# Patient Record
Sex: Female | Born: 1980 | Race: Black or African American | Hispanic: No | Marital: Single | Smoking: Current every day smoker
Health system: Southern US, Community
[De-identification: ages and names within clinical notes are randomized; demographics above are authoritative.]

## PROBLEM LIST (undated history)

## (undated) ENCOUNTER — Emergency Department (HOSPITAL_COMMUNITY): Admission: EM | Payer: No Typology Code available for payment source | Source: Home / Self Care

## (undated) ENCOUNTER — Emergency Department (HOSPITAL_BASED_OUTPATIENT_CLINIC_OR_DEPARTMENT_OTHER): Admission: EM | Payer: BC Managed Care – PPO | Source: Home / Self Care

## (undated) DIAGNOSIS — R51 Headache: Secondary | ICD-10-CM

## (undated) DIAGNOSIS — R519 Headache, unspecified: Secondary | ICD-10-CM

## (undated) DIAGNOSIS — K429 Umbilical hernia without obstruction or gangrene: Secondary | ICD-10-CM

## (undated) DIAGNOSIS — R0600 Dyspnea, unspecified: Secondary | ICD-10-CM

## (undated) HISTORY — PX: HERNIA REPAIR: SHX51

---

## 2011-09-16 ENCOUNTER — Emergency Department (HOSPITAL_BASED_OUTPATIENT_CLINIC_OR_DEPARTMENT_OTHER)
Admission: EM | Admit: 2011-09-16 | Discharge: 2011-09-16 | Disposition: A | Discharge status: Home / Self Care | Payer: Self-pay | Attending: Emergency Medicine | Admitting: Emergency Medicine

## 2011-09-16 ENCOUNTER — Encounter (HOSPITAL_BASED_OUTPATIENT_CLINIC_OR_DEPARTMENT_OTHER): Payer: Self-pay | Admitting: *Deleted

## 2011-09-16 DIAGNOSIS — N760 Acute vaginitis: Secondary | ICD-10-CM | POA: Insufficient documentation

## 2011-09-16 DIAGNOSIS — R109 Unspecified abdominal pain: Secondary | ICD-10-CM | POA: Insufficient documentation

## 2011-09-16 DIAGNOSIS — A64 Unspecified sexually transmitted disease: Secondary | ICD-10-CM | POA: Insufficient documentation

## 2011-09-16 LAB — PREGNANCY, URINE: Preg Test, Ur: NEGATIVE

## 2011-09-16 LAB — WET PREP, GENITAL: Yeast Wet Prep HPF POC: NONE SEEN

## 2011-09-16 LAB — URINALYSIS, ROUTINE W REFLEX MICROSCOPIC
Bilirubin Urine: NEGATIVE
Glucose, UA: NEGATIVE mg/dL
Hgb urine dipstick: NEGATIVE
Protein, ur: NEGATIVE mg/dL
Urobilinogen, UA: 1 mg/dL (ref 0.0–1.0)

## 2011-09-16 LAB — URINE MICROSCOPIC-ADD ON

## 2011-09-16 MED ORDER — CEFTRIAXONE SODIUM 250 MG IJ SOLR
250.0000 mg | Freq: Once | INTRAMUSCULAR | Status: AC
  Administered 2011-09-16: 250 mg via INTRAMUSCULAR
  Filled 2011-09-16: qty 250

## 2011-09-16 MED ORDER — AZITHROMYCIN 250 MG PO TABS
1000.0000 mg | ORAL_TABLET | Freq: Once | ORAL | Status: AC
  Administered 2011-09-16: 1000 mg via ORAL
  Filled 2011-09-16: qty 4

## 2011-09-16 MED ORDER — LIDOCAINE HCL (PF) 1 % IJ SOLN
INTRAMUSCULAR | Status: AC
  Administered 2011-09-16: 03:00:00
  Filled 2011-09-16: qty 5

## 2011-09-16 MED ORDER — METRONIDAZOLE 500 MG PO TABS: 500.0000 mg | ORAL_TABLET | Freq: Two times a day (BID) | ORAL | Status: AC

## 2011-09-16 NOTE — ED Notes (Signed)
Pt states she has had white vaginal d/c x 1-1/2 weeks. Low abd pain and tender around umbilicus x 1 week. Denies other s/s.

## 2011-09-16 NOTE — Discharge Instructions (Signed)
Bacterial Vaginosis Bacterial vaginosis (BV) is a vaginal infection where the normal balance of bacteria in the vagina is disrupted. The normal balance is then replaced by an overgrowth of certain bacteria. There are several different kinds of bacteria that can cause BV. BV is the most common vaginal infection in women of childbearing age. CAUSES   The cause of BV is not fully understood. BV develops when there is an increase or imbalance of harmful bacteria.   Some activities or behaviors can upset the normal balance of bacteria in the vagina and put women at increased risk including:   Having a new sex partner or multiple sex partners.   Douching.   Using an intrauterine device (IUD) for contraception.   It is not clear what role sexual activity plays in the development of BV. However, women that have never had sexual intercourse are rarely infected with BV.  Women do not get BV from toilet seats, bedding, swimming pools or from touching objects around them.  SYMPTOMS   Grey vaginal discharge.   A fish-like odor with discharge, especially after sexual intercourse.   Itching or burning of the vagina and vulva.   Burning or pain with urination.   Some women have no signs or symptoms at all.  DIAGNOSIS  Your caregiver must examine the vagina for signs of BV. Your caregiver will perform lab tests and look at the sample of vaginal fluid through a microscope. They will look for bacteria and abnormal cells (clue cells), a pH test higher than 4.5, and a positive amine test all associated with BV.  RISKS AND COMPLICATIONS   Pelvic inflammatory disease (PID).   Infections following gynecology surgery.   Developing HIV.   Developing herpes virus.  TREATMENT  Sometimes BV will clear up without treatment. However, all women with symptoms of BV should be treated to avoid complications, especially if gynecology surgery is planned. Female partners generally do not need to be treated. However,  BV may spread between female sex partners so treatment is helpful in preventing a recurrence of BV.   BV may be treated with antibiotics. The antibiotics come in either pill or vaginal cream forms. Either can be used with nonpregnant or pregnant women, but the recommended dosages differ. These antibiotics are not harmful to the baby.   BV can recur after treatment. If this happens, a second round of antibiotics will often be prescribed.   Treatment is important for pregnant women. If not treated, BV can cause a premature delivery, especially for a pregnant woman who had a premature birth in the past. All pregnant women who have symptoms of BV should be checked and treated.   For chronic reoccurrence of BV, treatment with a type of prescribed gel vaginally twice a week is helpful.  HOME CARE INSTRUCTIONS   Finish all medication as directed by your caregiver.   Do not have sex until treatment is completed.   Tell your sexual partner that you have a vaginal infection. They should see their caregiver and be treated if they have problems, such as a mild rash or itching.   Practice safe sex. Use condoms. Only have 1 sex partner.  PREVENTION  Basic prevention steps can help reduce the risk of upsetting the natural balance of bacteria in the vagina and developing BV:  Do not have sexual intercourse (be abstinent).   Do not douche.   Use all of the medicine prescribed for treatment of BV, even if the signs and symptoms go away.     Tell your sex partner if you have BV. That way, they can be treated, if needed, to prevent reoccurrence.  SEEK MEDICAL CARE IF:   Your symptoms are not improving after 3 days of treatment.   You have increased discharge, pain, or fever.  MAKE SURE YOU:   Understand these instructions.   Will watch your condition.   Will get help right away if you are not doing well or get worse.  FOR MORE INFORMATION  Division of STD Prevention (DSTDP), Centers for Disease  Control and Prevention: www.cdc.gov/std American Social Health Association (ASHA): www.ashastd.org  Document Released: 05/28/2005 Document Revised: 05/17/2011 Document Reviewed: 11/18/2008 ExitCare Patient Information 2012 [employer].Cervicitis Cervicitis is a soreness and swelling (inflammation) of the cervix. Your cervix is located at the bottom of your uterus which opens up to the vagina.  CAUSES   Sexually transmitted infections (STIs).   Allergic reaction.   Medicines or birth control devices that are put in the vagina.   Injury to the cervix.   Bacterial infections.  SYMPTOMS  There may be no symptoms. If symptoms occur, they may include:  Grey, white, yellow, or bad smelling vaginal discharge.   Pain or itching of the area outside the vagina.   Painful sexual intercourse.   Lower abdominal or lower back pain, especially during intercourse.   Frequent urination.   Abnormal vaginal bleeding between periods, after sexual intercourse, or after menopause.   Pressure or a heavy feeling in the pelvis.  DIAGNOSIS  Diagnosis is made after a pelvic exam. Other tests may include:  Examination of any discharge under a microscope (wet prep).   A Pap test.  TREATMENT  Treatment will depend on the cause of cervicitis. If it is caused by an STI, both you and your partner will need to be treated. Antibiotic medicines will be given. HOME CARE INSTRUCTIONS   Do not have sexual intercourse until your caregiver says it is okay.   Do not have sexual intercourse until your partner has been treated if your cervicitis is caused by an STI.   Take your antibiotics as directed. Finish them even if you start to feel better.  SEEK IMMEDIATE MEDICAL CARE IF:   Your symptoms come back.   You have a fever.   You experience any problems that may be related to the medicine you are taking.  MAKE SURE YOU:   Understand these instructions.   Will watch your condition.   Will get  help right away if you are not doing well or get worse.  Document Released: 05/28/2005 Document Revised: 05/17/2011 Document Reviewed: 12/25/2010 Exit[employer] Patient Information 2012 [employer]. 

## 2011-09-16 NOTE — ED Provider Notes (Signed)
History     CSN: [id number]  Arrival date & time 09/16/11  0038   First MD Initiated Contact with Patient 09/16/11 [phone]      Chief Complaint  Patient presents with  . Abdominal Pain    (Consider location/radiation/quality/duration/timing/severity/associated sxs/prior treatment) Patient is a 31 y.o. female presenting with abdominal pain. The history is provided by the patient.  Abdominal Pain The primary symptoms of the illness include abdominal pain and vaginal discharge. The primary symptoms of the illness do not include fever, nausea, vomiting, diarrhea, dysuria or vaginal bleeding. Episode onset: One week ago. The onset of the illness was gradual. The problem has been gradually worsening.  The abdominal pain is located in the suprapubic region. The abdominal pain does not radiate. The severity of the abdominal pain is 7/10. The abdominal pain is relieved by nothing.  Onset:  one week ago. Vaginal discharge is a new problem. The patient believes that the vaginal discharge is worsening since it began. The amount of discharge is moderate. The color of the discharge is white. The vaginal discharge is not associated with itching, burning or dysuria.   The patient states that she believes she is currently not pregnant. The patient has not had a change in bowel habit. Symptoms associated with the illness do not include anorexia or back pain. Significant associated medical issues do not include GERD, diabetes or gallstones.    History reviewed. No pertinent past medical history.  History reviewed. No pertinent past surgical history.  History reviewed. No pertinent family history.  History  Substance Use Topics  . Smoking status: Current Everyday Smoker  . Smokeless tobacco: Not on file  . Alcohol Use: No    OB History    Grav Para Term Preterm Abortions TAB SAB Ect Mult Living                  Review of Systems  Constitutional: Negative for fever.  Gastrointestinal: Positive  for abdominal pain. Negative for nausea, vomiting, diarrhea and anorexia.  Genitourinary: Positive for vaginal discharge. Negative for dysuria and vaginal bleeding.  Musculoskeletal: Negative for back pain.  Skin: Negative for itching.  All other systems reviewed and are negative.    Allergies  Review of patient's allergies indicates no known allergies.  Home Medications  No current outpatient prescriptions on file.  BP 118/78  Pulse 68  Temp(Src) 98.1 F (36.7 C) (Oral)  Resp 20  Ht 5\' 8"  (1.727 m)  Wt 190 lb (86.183 kg)  BMI 28.89 kg/m2  SpO2 95%  LMP 09/09/2011  Physical Exam  Nursing note and vitals reviewed. Constitutional: She is oriented to person, place, and time. She appears well-developed and well-nourished. She appears distressed.  HENT:  Head: Normocephalic and atraumatic.  Eyes: EOM are normal. Pupils are equal, round, and reactive to light.  Cardiovascular: Normal rate, regular rhythm, normal heart sounds and intact distal pulses.  Exam reveals no friction rub.   No murmur heard. Pulmonary/Chest: Effort normal and breath sounds normal. She has no wheezes. She has no rales.  Abdominal: Soft. Bowel sounds are normal. She exhibits no distension. There is tenderness in the suprapubic area. There is no rebound and no guarding. No hernia.  Genitourinary: Uterus normal. Uterus is not enlarged and not tender. Cervix exhibits discharge. Cervix exhibits no motion tenderness and no friability. Right adnexum displays tenderness. Right adnexum displays no mass and no fullness. Left adnexum displays no mass, no tenderness and no fullness. Vaginal discharge found.  Musculoskeletal: Normal  range of motion. She exhibits no tenderness.       No edema  Neurological: She is alert and oriented to person, place, and time. No cranial nerve deficit.  Skin: Skin is warm and dry. No rash noted.  Psychiatric: She has a normal mood and affect. Her behavior is normal.    ED Course    Procedures (including critical care time)  Labs Reviewed  URINALYSIS, ROUTINE W REFLEX MICROSCOPIC - Abnormal; Notable for the following:    Leukocytes, UA SMALL (*)    All other components within normal limits  WET PREP, GENITAL - Abnormal; Notable for the following:    Clue Cells Wet Prep HPF POC MODERATE (*)    WBC, Wet Prep HPF POC MANY (*)    All other components within normal limits  URINE MICROSCOPIC-ADD ON - Abnormal; Notable for the following:    Squamous Epithelial / LPF FEW (*)    Bacteria, UA FEW (*)    All other components within normal limits  PREGNANCY, URINE  GC/CHLAMYDIA PROBE AMP, GENITAL   No results found.   No diagnosis found.    MDM   Patient with an abdominal pain and vaginal discharge for the last one week. She denies any new sexual partners but does have unprotected sex. She denies any fever, nausea, vomiting, diarrhea. On exam she has diffuse lower pelvic pain and on pelvic exam some mild right adnexal tenderness no cervical motion tenderness and mild vaginal discharge. Exam and there are no signs of him the local hernias or incarcerated hernias that would be causing the abdominal pain. Most likely GU in origin. Denies any dysuria. He UPT negative. UA, wet prep and GC chlamydia pending.  1:53 AM Many white blood cells on wet prep and signs of bacterial vaginosis. Concern for possible STD and this was discussed with patient and she was covered with Rocephin and azithromycin. She was given prescription for Flagyl.     Gwyneth Sprout, MD 09/16/11 [phone]

## 2011-09-17 LAB — GC/CHLAMYDIA PROBE AMP, GENITAL: GC Probe Amp, Genital: NEGATIVE

## 2012-01-10 ENCOUNTER — Emergency Department (HOSPITAL_BASED_OUTPATIENT_CLINIC_OR_DEPARTMENT_OTHER)
Admission: EM | Admit: 2012-01-10 | Discharge: 2012-01-10 | Disposition: A | Discharge status: Home / Self Care | Payer: BC Managed Care – PPO | Attending: Emergency Medicine | Admitting: Emergency Medicine

## 2012-01-10 ENCOUNTER — Encounter (HOSPITAL_BASED_OUTPATIENT_CLINIC_OR_DEPARTMENT_OTHER): Payer: Self-pay | Admitting: Emergency Medicine

## 2012-01-10 DIAGNOSIS — K458 Other specified abdominal hernia without obstruction or gangrene: Secondary | ICD-10-CM | POA: Insufficient documentation

## 2012-01-10 DIAGNOSIS — N921 Excessive and frequent menstruation with irregular cycle: Secondary | ICD-10-CM | POA: Insufficient documentation

## 2012-01-10 DIAGNOSIS — F17200 Nicotine dependence, unspecified, uncomplicated: Secondary | ICD-10-CM | POA: Insufficient documentation

## 2012-01-10 DIAGNOSIS — R112 Nausea with vomiting, unspecified: Secondary | ICD-10-CM | POA: Insufficient documentation

## 2012-01-10 DIAGNOSIS — B9689 Other specified bacterial agents as the cause of diseases classified elsewhere: Secondary | ICD-10-CM

## 2012-01-10 LAB — WET PREP, GENITAL
Trich, Wet Prep: NONE SEEN
Yeast Wet Prep HPF POC: NONE SEEN

## 2012-01-10 MED ORDER — METRONIDAZOLE 500 MG PO TABS: 500.0000 mg | ORAL_TABLET | Freq: Two times a day (BID) | ORAL | Status: AC

## 2012-01-10 NOTE — ED Notes (Signed)
States she has had a hernia for "a while" but starting to hurt now for past two weeks.  Irregular periods since last month including this month.  States a discharge after each cycle.

## 2012-01-10 NOTE — ED Notes (Signed)
Has had a hernia for 8 years but for the past 2 weeks has been increasingly sore. For the past 3 days has been very sore and tender to touch. Last month had a moderate amount of clear discharge in between her period also had some spotting. After the most recent period she has had white discharge but no spotting. Denies odor to both. Also noticed this week she had a new piece of tissue near her rectum reports it is sore, feels "like there is a cut in it." No bleeding with BM but pain increases with BM.

## 2012-01-10 NOTE — ED Provider Notes (Signed)
History     CSN: [id number]  Arrival date & time 01/10/12  1933   None     Chief Complaint  Patient presents with  . Hernia  . Nausea  . Emesis  . Metrorrhagia    (Consider location/radiation/quality/duration/timing/severity/associated sxs/prior treatment) Patient is a 31 y.o. female presenting with abdominal pain. The history is provided by the patient. No language interpreter was used.  Abdominal Pain The primary symptoms of the illness include abdominal pain and vaginal discharge. Episode onset: 2 weeks. The onset of the illness was gradual. The problem has been gradually worsening.  The patient has not had a change in bowel habit.  Pt reports she has an abdominal hernia.  Pt reports she has had pain at hernia site but it has been worse for the past 2 weeks.  History reviewed. No pertinent past medical history.  History reviewed. No pertinent past surgical history.  No family history on file.  History  Substance Use Topics  . Smoking status: Current Everyday Smoker  . Smokeless tobacco: Not on file  . Alcohol Use: No    OB History    Grav Para Term Preterm Abortions TAB SAB Ect Mult Living                  Review of Systems  Gastrointestinal: Positive for abdominal pain.  Genitourinary: Positive for vaginal discharge.  All other systems reviewed and are negative.    Allergies  Review of patient's allergies indicates no known allergies.  Home Medications  No current outpatient prescriptions on file.  BP 121/73  Pulse 84  Temp 98.6 F (37 C) (Oral)  Resp 16  Ht 5\' 8"  (1.727 m)  Wt 185 lb (83.915 kg)  BMI 28.13 kg/m2  SpO2 98%  LMP 12/26/2011  Physical Exam  Nursing note and vitals reviewed. Constitutional: She is oriented to person, place, and time. She appears well-developed and well-nourished.  HENT:  Head: Normocephalic and atraumatic.  Eyes: Pupils are equal, round, and reactive to light.  Neck: Normal range of motion.  Cardiovascular:  Normal rate and normal heart sounds.   Pulmonary/Chest: Effort normal.  Abdominal: Soft. There is tenderness.       No hernia palpated,  Defect above umbilicus  Genitourinary: Vaginal discharge found.       cevix and adnexa nontender  Musculoskeletal: Normal range of motion.  Neurological: She is alert and oriented to person, place, and time.  Skin: Skin is warm.  Psychiatric: She has a normal mood and affect.    ED Course  Procedures (including critical care time)  Labs Reviewed - No data to display No results found.   No diagnosis found.  Results for orders placed during the hospital encounter of 01/10/12  WET PREP, GENITAL      Component Value Range   Yeast Wet Prep HPF POC NONE SEEN  NONE SEEN   Trich, Wet Prep NONE SEEN  NONE SEEN   Clue Cells Wet Prep HPF POC MODERATE (*) NONE SEEN   WBC, Wet Prep HPF POC MODERATE (*) NONE SEEN  PREGNANCY, URINE      Component Value Range   Preg Test, Ur NEGATIVE  NEGATIVE   No results found.   MDM  Pt given rx for flagyl.   Pt advised to follow up with surgeon for evation of hernia        Lonia Skinner [place], [place] 01/10/12 2121

## 2012-01-11 LAB — GC/CHLAMYDIA PROBE AMP, GENITAL
Chlamydia, DNA Probe: NEGATIVE
GC Probe Amp, Genital: NEGATIVE

## 2012-01-11 NOTE — ED Provider Notes (Signed)
Medical screening examination/treatment/procedure(s) were performed by non-physician practitioner and as supervising physician I was immediately available for consultation/collaboration.   Gwyneth Sprout, MD 01/11/12 [phone]

## 2015-01-03 ENCOUNTER — Encounter (HOSPITAL_BASED_OUTPATIENT_CLINIC_OR_DEPARTMENT_OTHER): Payer: Self-pay | Admitting: *Deleted

## 2015-01-03 ENCOUNTER — Emergency Department (HOSPITAL_BASED_OUTPATIENT_CLINIC_OR_DEPARTMENT_OTHER)
Admission: EM | Admit: 2015-01-03 | Discharge: 2015-01-03 | Disposition: A | Discharge status: Home / Self Care | Payer: BLUE CROSS/BLUE SHIELD | Attending: Emergency Medicine | Admitting: Emergency Medicine

## 2015-01-03 DIAGNOSIS — L0231 Cutaneous abscess of buttock: Secondary | ICD-10-CM | POA: Diagnosis not present

## 2015-01-03 DIAGNOSIS — Z720 Tobacco use: Secondary | ICD-10-CM | POA: Diagnosis not present

## 2015-01-03 DIAGNOSIS — M791 Myalgia: Secondary | ICD-10-CM | POA: Diagnosis present

## 2015-01-03 MED ORDER — LIDOCAINE-EPINEPHRINE (PF) 2 %-1:200000 IJ SOLN
10.0000 mL | Freq: Once | INTRAMUSCULAR | Status: AC
  Administered 2015-01-03: 10 mL via INTRADERMAL
  Filled 2015-01-03: qty 10

## 2015-01-03 NOTE — ED Notes (Signed)
Pt sts she was bitten by something on her right buttocks 3 days ago at work. She believes it may be getting infected.

## 2015-01-03 NOTE — ED Provider Notes (Signed)
CSN: [id number]     Arrival date & time 01/03/15  1818 History  This chart was scribed for non-physician practitioner, Kathrynn Speed, PA-C working with Blake Divine, MD by Placido Sou, ED scribe. This patient was seen in room [facility]FT1/[facility]1 and the patient's care was started at 8:47 PM.   Chief Complaint  Patient presents with  . Insect Bite   The history is provided by the patient. No language interpreter was used.    HPI Comments: Tina Bauer is a 34 y.o. female who presents to the Emergency Department complaining of a point of irritation to her right buttocks with onset 3 days ago. 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 She notes associated itching, redness and mild, constant, pain, worse when sitting. She denies fever or chills.   History reviewed. No pertinent past medical history. History reviewed. No pertinent past surgical history. No family history on file. History  Substance Use Topics  . Smoking status: Current Every Day Smoker  . Smokeless tobacco: Not on file  . Alcohol Use: Yes   OB History    No data available     Review of Systems  Constitutional: Negative for fever and chills.  Musculoskeletal: Positive for myalgias.  Skin: Positive for color change.  All other systems reviewed and are negative.  Allergies  Review of patient's allergies indicates no known allergies.  Home Medications   Prior to Admission medications   Not on File   BP 116/73 mmHg  Pulse 86  Temp(Src) 98.5 F (36.9 C) (Oral)  Resp 18  Ht  (1.702 m)  Wt 205 lb (92.987 kg)  BMI 32.10 kg/m2  SpO2 99%  LMP 01/01/2015 Physical Exam  Constitutional: She is oriented to person, place, and time. She appears well-developed and well-nourished. No distress.  HENT:  Head: Normocephalic and atraumatic.  Mouth/Throat: Oropharynx is clear and moist.  Eyes: Conjunctivae are normal.  Neck: Normal range of motion. Neck supple.   Cardiovascular: Normal rate, regular rhythm and normal heart sounds.   Pulmonary/Chest: Effort normal and breath sounds normal.  Musculoskeletal: Normal range of motion. She exhibits no edema.  Neurological: She is alert and oriented to person, place, and time.  Skin: Skin is warm and dry. She is not diaphoretic.  3 cm area of induration to R buttock. No erythema or warmth. No drainage. Tender.  Psychiatric: She has a normal mood and affect. Her behavior is normal.    ED Course  Procedures   INCISION AND DRAINAGE Performed by: Celene Skeen Consent: Verbal consent obtained. Risks and benefits: risks, benefits and alternatives were discussed Type: abscess  Body area: right buttock  Anesthesia: local infiltration  Incision was made with a scalpel.  Local anesthetic: lidocaine 2% with epinephrine  Anesthetic total: 2 ml  Complexity: complex Blunt dissection to break up loculations  Drainage: purulent  Drainage amount: small  Packing material: none  Patient tolerance: Patient tolerated the procedure well with no immediate complications.   DIAGNOSTIC STUDIES: Oxygen Saturation is 99% on AR, normal by my interpretation.    COORDINATION OF CARE: 8:49 PM Discussed treatment plan with pt at bedside and pt agreed to plan.  Labs Review Labs Reviewed - No data to display  Imaging Review No results found.   EKG Interpretation None      MDM   Final diagnoses:  Abscess of buttock, right   NAD. AF VSS. No surrounding cellulitis. Abscess drained with  only a small amount of drainage. Advised warm compresses. Antibiotics are not appropriate at this time as there are no signs of surrounding cellulitis or fever. Stable for discharge. F/u with PCP within 1-2 days for recheck. Return precautions given. Patient states understanding of treatment care plan and is agreeable. Patient left prior to receiving discharge papers.  I personally performed the services described in this  documentation, which was scribed in my presence. The recorded information has been reviewed and is accurate.  Kathrynn Speed, PA-C 01/03/15 2159  Blake Divine, MD 01/05/15 [phone]

## 2015-01-03 NOTE — Discharge Instructions (Signed)
Abscess °An abscess is an infected area that contains a collection of pus and debris. It can occur in almost any part of the body. An abscess is also known as a furuncle or boil. °CAUSES  °An abscess occurs when tissue gets infected. This can occur from blockage of oil or sweat glands, infection of hair follicles, or a minor injury to the skin. As the body tries to fight the infection, pus collects in the area and creates pressure under the skin. This pressure causes pain. People with weakened immune systems have difficulty fighting infections and get certain abscesses more often.  °SYMPTOMS °Usually an abscess develops on the skin and becomes a painful mass that is red, warm, and tender. If the abscess forms under the skin, you may feel a moveable soft area under the skin. Some abscesses break open (rupture) on their own, but most will continue to get worse without care. The infection can spread deeper into the body and eventually into the bloodstream, causing you to feel ill.  °DIAGNOSIS  °Your caregiver will take your medical history and perform a physical exam. A sample of fluid may also be taken from the abscess to determine what is causing your infection. °TREATMENT  °Your caregiver may prescribe antibiotic medicines to fight the infection. However, taking antibiotics alone usually does not cure an abscess. Your caregiver may need to make a small cut (incision) in the abscess to drain the pus. In some cases, gauze is packed into the abscess to reduce pain and to continue draining the area. °HOME CARE INSTRUCTIONS  °· Only take over-the-counter or prescription medicines for pain, discomfort, or fever as directed by your caregiver. °· If you were prescribed antibiotics, take them as directed. Finish them even if you start to feel better. °· If gauze is used, follow your caregiver's directions for changing the gauze. °· To avoid spreading the infection: °· Keep your draining abscess covered with a  bandage. °· Wash your hands well. °· Do not share personal care items, towels, or whirlpools with others. °· Avoid skin contact with others. °· Keep your skin and clothes clean around the abscess. °· Keep all follow-up appointments as directed by your caregiver. °SEEK MEDICAL CARE IF:  °· You have increased pain, swelling, redness, fluid drainage, or bleeding. °· You have muscle aches, chills, or a general ill feeling. °· You have a fever. °MAKE SURE YOU:  °· Understand these instructions. °· Will watch your condition. °· Will get help right away if you are not doing well or get worse. °Document Released: 03/07/2005 Document Revised: 11/27/2011 Document Reviewed: 08/10/2011 °[employer]® Patient Information ©2015 [employer]. This information is not intended to replace advice given to you by your health care provider. Make sure you discuss any questions you have with your health care provider. ° °Abscess °Care After °An abscess (also called a boil or furuncle) is an infected area that contains a collection of pus. Signs and symptoms of an abscess include pain, tenderness, redness, or hardness, or you may feel a moveable soft area under your skin. An abscess can occur anywhere in the body. The infection may spread to surrounding tissues causing cellulitis. A cut (incision) by the surgeon was made over your abscess and the pus was drained out. Gauze may have been packed into the space to provide a drain that will allow the cavity to heal from the inside outwards. The boil may be painful for 5 to 7 days. Most people with a boil do not have   high fevers. Your abscess, if seen early, may not have localized, and may not have been lanced. If not, another appointment may be required for this if it does not get better on its own or with medications. °HOME CARE INSTRUCTIONS  °· Only take over-the-counter or prescription medicines for pain, discomfort, or fever as directed by your caregiver. °· When you bathe, soak and then  remove gauze or iodoform packs at least daily or as directed by your caregiver. You may then wash the wound gently with mild soapy water. Repack with gauze or do as your caregiver directs. °SEEK IMMEDIATE MEDICAL CARE IF:  °· You develop increased pain, swelling, redness, drainage, or bleeding in the wound site. °· You develop signs of generalized infection including muscle aches, chills, fever, or a general ill feeling. °· An oral temperature above 102° F (38.9° C) develops, not controlled by medication. °See your caregiver for a recheck if you develop any of the symptoms described above. If medications (antibiotics) were prescribed, take them as directed. °Document Released: 12/14/2004 Document Revised: 08/20/2011 Document Reviewed: 08/11/2007 °[employer]® Patient Information ©2015 [employer]. This information is not intended to replace advice given to you by your health care provider. Make sure you discuss any questions you have with your health care provider. ° °

## 2015-02-22 ENCOUNTER — Encounter (HOSPITAL_BASED_OUTPATIENT_CLINIC_OR_DEPARTMENT_OTHER): Payer: Self-pay | Admitting: Emergency Medicine

## 2015-02-22 ENCOUNTER — Emergency Department (HOSPITAL_BASED_OUTPATIENT_CLINIC_OR_DEPARTMENT_OTHER)
Admission: EM | Admit: 2015-02-22 | Discharge: 2015-02-23 | Disposition: A | Discharge status: Home / Self Care | Payer: BLUE CROSS/BLUE SHIELD | Attending: Emergency Medicine | Admitting: Emergency Medicine

## 2015-02-22 DIAGNOSIS — Z720 Tobacco use: Secondary | ICD-10-CM | POA: Insufficient documentation

## 2015-02-22 DIAGNOSIS — Z3202 Encounter for pregnancy test, result negative: Secondary | ICD-10-CM | POA: Diagnosis not present

## 2015-02-22 DIAGNOSIS — N898 Other specified noninflammatory disorders of vagina: Secondary | ICD-10-CM | POA: Insufficient documentation

## 2015-02-22 LAB — URINALYSIS, ROUTINE W REFLEX MICROSCOPIC
BILIRUBIN URINE: NEGATIVE
Glucose, UA: NEGATIVE mg/dL
Hgb urine dipstick: NEGATIVE
KETONES UR: NEGATIVE mg/dL
Leukocytes, UA: NEGATIVE
NITRITE: NEGATIVE
Protein, ur: NEGATIVE mg/dL
Specific Gravity, Urine: 1.038 — ABNORMAL HIGH (ref 1.005–1.030)
UROBILINOGEN UA: 1 mg/dL (ref 0.0–1.0)
pH: 6 (ref 5.0–8.0)

## 2015-02-22 LAB — PREGNANCY, URINE: PREG TEST UR: NEGATIVE

## 2015-02-22 NOTE — ED Notes (Signed)
Patient reports that she is having an abnormal discharge post LMP - she also reports that she may have a boil in her perineal area.

## 2015-02-23 LAB — WET PREP, GENITAL
Trich, Wet Prep: NONE SEEN
WBC, Wet Prep HPF POC: NONE SEEN
YEAST WET PREP: NONE SEEN

## 2015-02-23 MED ORDER — MUPIROCIN CALCIUM 2 % EX CREA
TOPICAL_CREAM | Freq: Three times a day (TID) | CUTANEOUS | Status: DC
  Administered 2015-02-23: 02:00:00 via TOPICAL
  Filled 2015-02-23: qty 15

## 2015-02-23 NOTE — ED Provider Notes (Addendum)
CSN: [id number]     Arrival date & time 02/22/15  2245 History   First MD Initiated Contact with Patient 02/23/15 0103     Chief Complaint  Patient presents with  . Vaginal Discharge     (Consider location/radiation/quality/duration/timing/severity/associated sxs/prior Treatment) HPI  This is a 34 year old female who had a vaginal discharge before her most recent menses, 02/21/2015. After her menses ended the discharge recurred. The discharge is white with yellow tinge. It is not profuse. It has a somewhat abnormal odor. She has occasional cramping pain which is not severe. She denies fever, chills, nausea, vomiting or diarrhea.  History reviewed. No pertinent past medical history. History reviewed. No pertinent past surgical history. History reviewed. No pertinent family history. Social History  Substance Use Topics  . Smoking status: Current Every Day Smoker  . Smokeless tobacco: None  . Alcohol Use: Yes   OB History    No data available     Review of Systems  All other systems reviewed and are negative.   Allergies  Review of patient's allergies indicates no known allergies.  Home Medications   Prior to Admission medications   Not on File   BP 122/75 mmHg  Pulse 80  Temp(Src) 97.9 F (36.6 C) (Oral)  Resp 16  Ht 5\' 7"  (1.702 m)  Wt 198 lb (89.812 kg)  BMI 31.00 kg/m2  SpO2 81%  LMP 02/21/2015   Physical Exam  General: Well-developed, well-nourished female in no acute distress; appearance consistent with age of record HENT: normocephalic; atraumatic Eyes: pupils equal, round and reactive to light; extraocular muscles intact Neck: supple Heart: regular rate and rhythm Lungs: clear to auscultation bilaterally Abdomen: soft; nondistended; nontender; no masses or hepatosplenomegaly; bowel sounds present GU: No CVA tenderness; normal external genitalia; yellow, mucoid vaginal discharge; no vaginal bleeding; equivocal cervical motion tenderness Extremities: No  deformity; full range of motion; pulses normal Neurologic: Awake, alert and oriented; motor function intact in all extremities and symmetric; no facial droop Skin: Warm and dry Psychiatric: Normal mood and affect    ED Course  Procedures (including critical care time)   MDM   Nursing notes and vitals signs, including pulse oximetry, reviewed.  Summary of this visit's results, reviewed by myself:  Labs:  Results for orders placed or performed during the hospital encounter of 02/22/15 (from the past 24 hour(s))  Urinalysis, Routine w reflex microscopic (not at [facility])     Status: Abnormal   Collection Time: 02/22/15 11:00 PM  Result Value Ref Range   Color, Urine YELLOW YELLOW   APPearance CLOUDY (A) CLEAR   Specific Gravity, Urine 1.038 (H) 1.005 - 1.030   pH 6.0 5.0 - 8.0   Glucose, UA NEGATIVE NEGATIVE mg/dL   Hgb urine dipstick NEGATIVE NEGATIVE   Bilirubin Urine NEGATIVE NEGATIVE   Ketones, ur NEGATIVE NEGATIVE mg/dL   Protein, ur NEGATIVE NEGATIVE mg/dL   Urobilinogen, UA 1.0 0.0 - 1.0 mg/dL   Nitrite NEGATIVE NEGATIVE   Leukocytes, UA NEGATIVE NEGATIVE  Pregnancy, urine     Status: None   Collection Time: 02/22/15 11:00 PM  Result Value Ref Range   Preg Test, Ur NEGATIVE NEGATIVE  Wet prep, genital     Status: Abnormal   Collection Time: 02/23/15  1:15 AM  Result Value Ref Range   Yeast Wet Prep HPF POC NONE SEEN NONE SEEN   Trich, Wet Prep NONE SEEN NONE SEEN   Clue Cells Wet Prep HPF POC FEW (A) NONE SEEN   WBC,  Wet Prep HPF POC NONE SEEN NONE SEEN   Patient is advised that GC/chlamydia testing is pending.    Paula Libra, MD 02/23/15 1610  Paula Libra, MD 02/23/15 [id number]

## 2015-02-24 LAB — GC/CHLAMYDIA PROBE AMP (~~LOC~~) NOT AT ARMC
CHLAMYDIA, DNA PROBE: NEGATIVE
NEISSERIA GONORRHEA: NEGATIVE

## 2015-03-01 ENCOUNTER — Telehealth (HOSPITAL_COMMUNITY): Payer: Self-pay

## 2016-06-23 ENCOUNTER — Emergency Department (HOSPITAL_COMMUNITY)
Admission: EM | Admit: 2016-06-23 | Discharge: 2016-06-23 | Disposition: A | Discharge status: Home / Self Care | Payer: Self-pay | Attending: Emergency Medicine | Admitting: Emergency Medicine

## 2016-06-23 ENCOUNTER — Encounter (HOSPITAL_COMMUNITY): Payer: Self-pay | Admitting: *Deleted

## 2016-06-23 ENCOUNTER — Emergency Department (HOSPITAL_COMMUNITY): Payer: Self-pay

## 2016-06-23 DIAGNOSIS — R938 Abnormal findings on diagnostic imaging of other specified body structures: Secondary | ICD-10-CM | POA: Insufficient documentation

## 2016-06-23 DIAGNOSIS — S50312A Abrasion of left elbow, initial encounter: Secondary | ICD-10-CM | POA: Insufficient documentation

## 2016-06-23 DIAGNOSIS — F10129 Alcohol abuse with intoxication, unspecified: Secondary | ICD-10-CM | POA: Insufficient documentation

## 2016-06-23 DIAGNOSIS — Z23 Encounter for immunization: Secondary | ICD-10-CM | POA: Insufficient documentation

## 2016-06-23 DIAGNOSIS — Y92410 Unspecified street and highway as the place of occurrence of the external cause: Secondary | ICD-10-CM | POA: Insufficient documentation

## 2016-06-23 DIAGNOSIS — Y939 Activity, unspecified: Secondary | ICD-10-CM | POA: Insufficient documentation

## 2016-06-23 DIAGNOSIS — F10920 Alcohol use, unspecified with intoxication, uncomplicated: Secondary | ICD-10-CM

## 2016-06-23 DIAGNOSIS — R930 Abnormal findings on diagnostic imaging of skull and head, not elsewhere classified: Secondary | ICD-10-CM | POA: Insufficient documentation

## 2016-06-23 DIAGNOSIS — R1032 Left lower quadrant pain: Secondary | ICD-10-CM | POA: Insufficient documentation

## 2016-06-23 DIAGNOSIS — S52502A Unspecified fracture of the lower end of left radius, initial encounter for closed fracture: Secondary | ICD-10-CM | POA: Insufficient documentation

## 2016-06-23 DIAGNOSIS — Y999 Unspecified external cause status: Secondary | ICD-10-CM | POA: Insufficient documentation

## 2016-06-23 LAB — CBC
HCT: 38.8 % (ref 36.0–46.0)
Hemoglobin: 13.2 g/dL (ref 12.0–15.0)
MCH: 31.9 pg (ref 26.0–34.0)
MCHC: 34 g/dL (ref 30.0–36.0)
MCV: 93.7 fL (ref 78.0–100.0)
PLATELETS: 262 10*3/uL (ref 150–400)
RBC: 4.14 MIL/uL (ref 3.87–5.11)
RDW: 13.1 % (ref 11.5–15.5)
WBC: 13.1 10*3/uL — ABNORMAL HIGH (ref 4.0–10.5)

## 2016-06-23 LAB — I-STAT CHEM 8, ED
BUN: 10 mg/dL (ref 6–20)
CHLORIDE: 109 mmol/L (ref 101–111)
CREATININE: 1.2 mg/dL — AB (ref 0.44–1.00)
Calcium, Ion: 1.09 mmol/L — ABNORMAL LOW (ref 1.15–1.40)
GLUCOSE: 147 mg/dL — AB (ref 65–99)
HEMATOCRIT: 39 % (ref 36.0–46.0)
Hemoglobin: 13.3 g/dL (ref 12.0–15.0)
POTASSIUM: 3.5 mmol/L (ref 3.5–5.1)
Sodium: 143 mmol/L (ref 135–145)
TCO2: 23 mmol/L (ref 0–100)

## 2016-06-23 LAB — ETHANOL: ALCOHOL ETHYL (B): 173 mg/dL — AB (ref <5)

## 2016-06-23 LAB — COMPREHENSIVE METABOLIC PANEL
ALK PHOS: 63 U/L (ref 38–126)
ALT: 18 U/L (ref 14–54)
AST: 20 U/L (ref 15–41)
Albumin: 3.6 g/dL (ref 3.5–5.0)
Anion gap: 8 (ref 5–15)
BILIRUBIN TOTAL: 0.2 mg/dL — AB (ref 0.3–1.2)
BUN: 9 mg/dL (ref 6–20)
CALCIUM: 8.6 mg/dL — AB (ref 8.9–10.3)
CO2: 22 mmol/L (ref 22–32)
CREATININE: 1.05 mg/dL — AB (ref 0.44–1.00)
Chloride: 110 mmol/L (ref 101–111)
GFR calc Af Amer: >60 mL/min (ref >60)
Glucose, Bld: 149 mg/dL — ABNORMAL HIGH (ref 65–99)
Potassium: 3.4 mmol/L — ABNORMAL LOW (ref 3.5–5.1)
Sodium: 140 mmol/L (ref 135–145)
Total Protein: 6.3 g/dL — ABNORMAL LOW (ref 6.5–8.1)

## 2016-06-23 LAB — I-STAT CG4 LACTIC ACID, ED: Lactic Acid, Venous: 2.03 mmol/L (ref 0.5–1.9)

## 2016-06-23 LAB — PROTIME-INR
INR: 1
Prothrombin Time: 13.2 seconds (ref 11.4–15.2)

## 2016-06-23 LAB — CDS SEROLOGY

## 2016-06-23 LAB — SAMPLE TO BLOOD BANK

## 2016-06-23 LAB — HCG, QUANTITATIVE, PREGNANCY: HCG, BETA CHAIN, QUANT, S: 1 m[IU]/mL (ref <5)

## 2016-06-23 MED ORDER — TETANUS-DIPHTH-ACELL PERTUSSIS 5-2.5-18.5 LF-MCG/0.5 IM SUSP
0.5000 mL | Freq: Once | INTRAMUSCULAR | Status: AC
  Administered 2016-06-23: 0.5 mL via INTRAMUSCULAR
  Filled 2016-06-23: qty 0.5

## 2016-06-23 MED ORDER — FENTANYL CITRATE (PF) 100 MCG/2ML IJ SOLN: 50.0000 ug | Freq: Once | INTRAMUSCULAR | Status: DC

## 2016-06-23 MED ORDER — MORPHINE SULFATE 15 MG PO TABS: 15.0000 mg | ORAL_TABLET | ORAL | 0 refills | Status: DC | PRN

## 2016-06-23 MED ORDER — IOPAMIDOL (ISOVUE-300) INJECTION 61%
INTRAVENOUS | Status: AC
  Administered 2016-06-23: 100 mL
  Filled 2016-06-23: qty 100

## 2016-06-23 NOTE — Discharge Instructions (Signed)
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
## 2016-06-23 NOTE — ED Notes (Signed)
The p[t finally woke up asking for her mother  She did not give us her moithers number when she arrived

## 2016-06-23 NOTE — ED Notes (Addendum)
Pt's mother arrived and is at bedside

## 2016-06-23 NOTE — ED Notes (Addendum)
Pt discharged home with mother and sister. Pt given paper scrubs, taken out in wheelchair. Pt is alert, reporting soreness all over, is tearful.
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
## 2016-06-23 NOTE — Progress Notes (Signed)
Orthopedic Tech Progress Note Patient Details:  Tina BoraCrystal Bauer 03-Jun-1981 [id number]  Ortho Devices Type of Ortho Device: Ace wrap, Arm sling, Sugartong splint Ortho Device/Splint Interventions: Application   Saul FordyceJennifer C Jesica Goheen 06/23/2016, 7:54 AM
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
## 2016-06-24 ENCOUNTER — Encounter (HOSPITAL_BASED_OUTPATIENT_CLINIC_OR_DEPARTMENT_OTHER): Payer: Self-pay | Admitting: Emergency Medicine

## 2016-06-26 ENCOUNTER — Emergency Department (HOSPITAL_BASED_OUTPATIENT_CLINIC_OR_DEPARTMENT_OTHER)
Admission: EM | Admit: 2016-06-26 | Discharge: 2016-06-27 | Disposition: A | Discharge status: Home / Self Care | Payer: BLUE CROSS/BLUE SHIELD | Attending: Emergency Medicine | Admitting: Emergency Medicine

## 2016-06-26 ENCOUNTER — Encounter (HOSPITAL_BASED_OUTPATIENT_CLINIC_OR_DEPARTMENT_OTHER): Payer: Self-pay | Admitting: *Deleted

## 2016-06-26 DIAGNOSIS — S39011D Strain of muscle, fascia and tendon of abdomen, subsequent encounter: Secondary | ICD-10-CM | POA: Diagnosis not present

## 2016-06-26 DIAGNOSIS — S36429D Contusion of unspecified part of small intestine, subsequent encounter: Secondary | ICD-10-CM | POA: Insufficient documentation

## 2016-06-26 DIAGNOSIS — S76012D Strain of muscle, fascia and tendon of left hip, subsequent encounter: Secondary | ICD-10-CM

## 2016-06-26 DIAGNOSIS — S3991XD Unspecified injury of abdomen, subsequent encounter: Secondary | ICD-10-CM | POA: Diagnosis present
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
## 2016-06-27 ENCOUNTER — Emergency Department (HOSPITAL_BASED_OUTPATIENT_CLINIC_OR_DEPARTMENT_OTHER): Payer: BLUE CROSS/BLUE SHIELD

## 2016-06-27 DIAGNOSIS — S36429D Contusion of unspecified part of small intestine, subsequent encounter: Secondary | ICD-10-CM | POA: Diagnosis not present

## 2016-06-27 LAB — CBC WITH DIFFERENTIAL/PLATELET
BASOS ABS: 0 10*3/uL (ref 0.0–0.1)
BASOS PCT: 0 %
EOS ABS: 0.4 10*3/uL (ref 0.0–0.7)
EOS PCT: 3 %
HCT: 34.1 % — ABNORMAL LOW (ref 36.0–46.0)
Hemoglobin: 11.6 g/dL — ABNORMAL LOW (ref 12.0–15.0)
Lymphocytes Relative: 20 %
Lymphs Abs: 2.4 10*3/uL (ref 0.7–4.0)
MCH: 32 pg (ref 26.0–34.0)
MCHC: 34 g/dL (ref 30.0–36.0)
MCV: 94.2 fL (ref 78.0–100.0)
MONO ABS: 1.4 10*3/uL — AB (ref 0.1–1.0)
Monocytes Relative: 12 %
Neutro Abs: 7.8 10*3/uL — ABNORMAL HIGH (ref 1.7–7.7)
Neutrophils Relative %: 65 %
PLATELETS: 247 10*3/uL (ref 150–400)
RBC: 3.62 MIL/uL — AB (ref 3.87–5.11)
RDW: 12.2 % (ref 11.5–15.5)
WBC: 12.1 10*3/uL — AB (ref 4.0–10.5)

## 2016-06-27 LAB — COMPREHENSIVE METABOLIC PANEL
ALT: 21 U/L (ref 14–54)
AST: 24 U/L (ref 15–41)
Albumin: 3.2 g/dL — ABNORMAL LOW (ref 3.5–5.0)
Alkaline Phosphatase: 59 U/L (ref 38–126)
Anion gap: 6 (ref 5–15)
BUN: 16 mg/dL (ref 6–20)
CHLORIDE: 108 mmol/L (ref 101–111)
CO2: 22 mmol/L (ref 22–32)
Calcium: 8.5 mg/dL — ABNORMAL LOW (ref 8.9–10.3)
Creatinine, Ser: 0.68 mg/dL (ref 0.44–1.00)
GFR calc Af Amer: >60 mL/min (ref >60)
Glucose, Bld: 93 mg/dL (ref 65–99)
Potassium: 3.9 mmol/L (ref 3.5–5.1)
SODIUM: 136 mmol/L (ref 135–145)
Total Bilirubin: 0.4 mg/dL (ref 0.3–1.2)
Total Protein: 6.5 g/dL (ref 6.5–8.1)

## 2016-06-27 MED ORDER — ONDANSETRON HCL 4 MG/2ML IJ SOLN
4.0000 mg | Freq: Once | INTRAMUSCULAR | Status: AC
  Administered 2016-06-27: 4 mg via INTRAVENOUS
  Filled 2016-06-27: qty 2

## 2016-06-27 MED ORDER — FENTANYL CITRATE (PF) 100 MCG/2ML IJ SOLN
100.0000 ug | Freq: Once | INTRAMUSCULAR | Status: AC
  Administered 2016-06-27: 100 ug via INTRAVENOUS
  Filled 2016-06-27: qty 2

## 2016-06-27 MED ORDER — HYDROCODONE-ACETAMINOPHEN 5-325 MG PO TABS
1.0000 | ORAL_TABLET | Freq: Four times a day (QID) | ORAL | 0 refills | Status: DC | PRN
Start: 2016-06-27 — End: 2016-07-12

## 2016-06-27 MED ORDER — IOPAMIDOL (ISOVUE-300) INJECTION 61%
100.0000 mL | Freq: Once | INTRAVENOUS | Status: AC | PRN
  Administered 2016-06-27: 100 mL via INTRAVENOUS

## 2016-06-27 NOTE — ED Notes (Signed)
Pt reassessed, diffuse tenderness in all quadrants, no periumbilical or flank ecchymosis present

## 2016-07-05 ENCOUNTER — Ambulatory Visit: Payer: Self-pay | Admitting: Orthopedic Surgery

## 2016-07-06 ENCOUNTER — Encounter (HOSPITAL_COMMUNITY)
Admission: RE | Admit: 2016-07-06 | Discharge: 2016-07-06 | Disposition: A | Discharge status: Home / Self Care | Payer: BLUE CROSS/BLUE SHIELD | Source: Ambulatory Visit | Attending: Orthopedic Surgery | Admitting: Orthopedic Surgery

## 2016-07-06 ENCOUNTER — Other Ambulatory Visit (HOSPITAL_COMMUNITY): Payer: Self-pay | Admitting: *Deleted

## 2016-07-06 ENCOUNTER — Encounter (HOSPITAL_COMMUNITY): Payer: Self-pay

## 2016-07-06 DIAGNOSIS — Z01812 Encounter for preprocedural laboratory examination: Secondary | ICD-10-CM | POA: Diagnosis not present

## 2016-07-06 DIAGNOSIS — X58XXXA Exposure to other specified factors, initial encounter: Secondary | ICD-10-CM | POA: Diagnosis not present

## 2016-07-06 DIAGNOSIS — S5292XA Unspecified fracture of left forearm, initial encounter for closed fracture: Secondary | ICD-10-CM | POA: Insufficient documentation

## 2016-07-06 HISTORY — DX: Dyspnea, unspecified: R06.00

## 2016-07-06 LAB — CBC
HEMATOCRIT: 38.9 % (ref 36.0–46.0)
Hemoglobin: 13.5 g/dL (ref 12.0–15.0)
MCH: 32.5 pg (ref 26.0–34.0)
MCHC: 34.7 g/dL (ref 30.0–36.0)
MCV: 93.7 fL (ref 78.0–100.0)
PLATELETS: 384 10*3/uL (ref 150–400)
RBC: 4.15 MIL/uL (ref 3.87–5.11)
RDW: 12.8 % (ref 11.5–15.5)
WBC: 10.1 10*3/uL (ref 4.0–10.5)

## 2016-07-06 LAB — HCG, SERUM, QUALITATIVE: Preg, Serum: NEGATIVE

## 2016-07-06 LAB — SURGICAL PCR SCREEN
MRSA, PCR: NEGATIVE
STAPHYLOCOCCUS AUREUS: NEGATIVE

## 2016-07-06 NOTE — Pre-Procedure Instructions (Signed)
    Tina Bauer  07/06/2016      [facility] [place], [place] - [id number] [place] [address] Phone: [phone] Fax: [phone]  [facility] [phone] - [place], [place] - [address] [place] [id number] Phone: [phone] Fax: [phone]    Your procedure is scheduled on 07-07-2016    Saturday .  [facility]  The [facility] At 6:00 AM. Let them know that you are here for Surgery. They will check you in and get you to the right Department.    Call this number if you have problems the morning of surgery:  [phone]   Remember:  Do not eat food or drink liquids after midnight.   Take these medicines the morning of surgery with A SIP OF WATER none   Do not wear jewelry, make-up or nail polish.  Do not wear lotions, powders, or perfumes, or Deoderant.  Do not shave 48 hours prior to surgery.     Do not bring valuables to the hospital.  [facility] is not responsible for any belongings or valuables.  Contacts, dentures or bridgework may not be worn into surgery.  Leave your suitcase in the car.  After surgery it may be brought to your room.  For patients admitted to the hospital, discharge time will be determined by your treatment team.  Patients discharged the day of surgery will not be allowed to drive home.    Special instructions:  See the attached Sheet for instructions on CHG showers

## 2016-07-07 ENCOUNTER — Encounter (HOSPITAL_COMMUNITY): Admission: RE | Payer: Self-pay | Source: Ambulatory Visit

## 2016-07-07 ENCOUNTER — Ambulatory Visit (HOSPITAL_COMMUNITY)
Admission: RE | Admit: 2016-07-07 | Payer: BLUE CROSS/BLUE SHIELD | Source: Ambulatory Visit | Admitting: Orthopedic Surgery

## 2016-07-07 SURGERY — OPEN REDUCTION INTERNAL FIXATION (ORIF) DISTAL RADIUS FRACTURE
Anesthesia: General | Laterality: Left

## 2016-07-11 ENCOUNTER — Ambulatory Visit: Payer: Self-pay | Admitting: Orthopedic Surgery

## 2016-07-12 ENCOUNTER — Ambulatory Visit (HOSPITAL_COMMUNITY): Payer: BLUE CROSS/BLUE SHIELD | Admitting: Anesthesiology

## 2016-07-12 ENCOUNTER — Encounter (HOSPITAL_COMMUNITY): Payer: Self-pay | Admitting: Anesthesiology

## 2016-07-12 ENCOUNTER — Ambulatory Visit (HOSPITAL_COMMUNITY)
Admission: RE | Admit: 2016-07-12 | Discharge: 2016-07-12 | Disposition: A | Discharge status: Home / Self Care | Payer: BLUE CROSS/BLUE SHIELD | Source: Ambulatory Visit | Attending: Orthopedic Surgery | Admitting: Orthopedic Surgery

## 2016-07-12 ENCOUNTER — Encounter (HOSPITAL_COMMUNITY)
Admission: RE | Disposition: A | Discharge status: Home / Self Care | Payer: Self-pay | Source: Ambulatory Visit | Attending: Orthopedic Surgery

## 2016-07-12 DIAGNOSIS — S52572A Other intraarticular fracture of lower end of left radius, initial encounter for closed fracture: Secondary | ICD-10-CM | POA: Insufficient documentation

## 2016-07-12 DIAGNOSIS — Z888 Allergy status to other drugs, medicaments and biological substances status: Secondary | ICD-10-CM | POA: Insufficient documentation

## 2016-07-12 DIAGNOSIS — X58XXXA Exposure to other specified factors, initial encounter: Secondary | ICD-10-CM | POA: Insufficient documentation

## 2016-07-12 DIAGNOSIS — S52612A Displaced fracture of left ulna styloid process, initial encounter for closed fracture: Secondary | ICD-10-CM | POA: Insufficient documentation

## 2016-07-12 DIAGNOSIS — F17210 Nicotine dependence, cigarettes, uncomplicated: Secondary | ICD-10-CM | POA: Insufficient documentation

## 2016-07-12 HISTORY — PX: OPEN REDUCTION INTERNAL FIXATION (ORIF) DISTAL RADIAL FRACTURE: SHX5989

## 2016-07-12 SURGERY — OPEN REDUCTION INTERNAL FIXATION (ORIF) DISTAL RADIUS FRACTURE
Anesthesia: General | Site: Arm Lower | Laterality: Left

## 2016-07-12 MED ORDER — MIDAZOLAM HCL 2 MG/2ML IJ SOLN
INTRAMUSCULAR | Status: AC
  Filled 2016-07-12: qty 2

## 2016-07-12 MED ORDER — ONDANSETRON HCL 4 MG/2ML IJ SOLN: 4.0000 mg | Freq: Once | INTRAMUSCULAR | Status: DC | PRN

## 2016-07-12 MED ORDER — LIDOCAINE HCL (CARDIAC) 20 MG/ML IV SOLN
INTRAVENOUS | Status: DC | PRN
Start: 2016-07-12 — End: 2016-07-12
  Administered 2016-07-12: 40 mg via INTRAVENOUS

## 2016-07-12 MED ORDER — OXYCODONE HCL 5 MG PO TABS: 5.0000 mg | ORAL_TABLET | Freq: Once | ORAL | 0 refills | Status: DC | PRN

## 2016-07-12 MED ORDER — MIDAZOLAM HCL 2 MG/2ML IJ SOLN
2.0000 mg | Freq: Once | INTRAMUSCULAR | Status: AC
  Administered 2016-07-12: 2 mg via INTRAVENOUS

## 2016-07-12 MED ORDER — BUPIVACAINE HCL (PF) 0.25 % IJ SOLN
INTRAMUSCULAR | Status: AC
  Filled 2016-07-12: qty 30

## 2016-07-12 MED ORDER — FENTANYL CITRATE (PF) 100 MCG/2ML IJ SOLN
INTRAMUSCULAR | Status: AC
  Filled 2016-07-12: qty 2

## 2016-07-12 MED ORDER — OXYCODONE HCL 5 MG PO TABS: 5.0000 mg | ORAL_TABLET | Freq: Once | ORAL | Status: DC | PRN

## 2016-07-12 MED ORDER — 0.9 % SODIUM CHLORIDE (POUR BTL) OPTIME
TOPICAL | Status: DC | PRN
  Administered 2016-07-12: 1000 mL

## 2016-07-12 MED ORDER — LACTATED RINGERS IV SOLN
INTRAVENOUS | Status: DC | PRN
  Administered 2016-07-12: 16:00:00 via INTRAVENOUS

## 2016-07-12 MED ORDER — HYDROMORPHONE HCL 1 MG/ML IJ SOLN: 0.2500 mg | INTRAMUSCULAR | Status: DC | PRN

## 2016-07-12 MED ORDER — CHLORHEXIDINE GLUCONATE 4 % EX LIQD: 60.0000 mL | Freq: Once | CUTANEOUS | Status: DC

## 2016-07-12 MED ORDER — FENTANYL CITRATE (PF) 100 MCG/2ML IJ SOLN
INTRAMUSCULAR | Status: AC
  Filled 2016-07-12: qty 6

## 2016-07-12 MED ORDER — FENTANYL CITRATE (PF) 100 MCG/2ML IJ SOLN
100.0000 ug | Freq: Once | INTRAMUSCULAR | Status: AC
  Administered 2016-07-12: 100 ug via INTRAVENOUS

## 2016-07-12 MED ORDER — CEFAZOLIN SODIUM-DEXTROSE 2-4 GM/100ML-% IV SOLN
2.0000 g | INTRAVENOUS | Status: AC
  Administered 2016-07-12: 2 g via INTRAVENOUS

## 2016-07-12 MED ORDER — PROPOFOL 10 MG/ML IV BOLUS
INTRAVENOUS | Status: DC | PRN
  Administered 2016-07-12: 180 mg via INTRAVENOUS

## 2016-07-12 MED ORDER — PROPOFOL 10 MG/ML IV BOLUS
INTRAVENOUS | Status: AC
  Filled 2016-07-12: qty 20

## 2016-07-12 MED ORDER — ONDANSETRON HCL 4 MG/2ML IJ SOLN
INTRAMUSCULAR | Status: DC | PRN
  Administered 2016-07-12: 4 mg via INTRAVENOUS

## 2016-07-12 MED ORDER — SULFAMETHOXAZOLE-TRIMETHOPRIM 800-160 MG PO TABS: 1.0000 | ORAL_TABLET | Freq: Two times a day (BID) | ORAL | 0 refills | Status: DC

## 2016-07-12 MED ORDER — CEFAZOLIN SODIUM-DEXTROSE 2-4 GM/100ML-% IV SOLN
INTRAVENOUS | Status: AC
  Filled 2016-07-12: qty 100

## 2016-07-12 MED ORDER — LACTATED RINGERS IV SOLN: INTRAVENOUS | Status: DC

## 2016-07-12 MED ORDER — OXYCODONE HCL 5 MG/5ML PO SOLN: 5.0000 mg | Freq: Once | ORAL | Status: DC | PRN

## 2016-07-12 MED ORDER — MIDAZOLAM HCL 5 MG/5ML IJ SOLN
INTRAMUSCULAR | Status: DC | PRN
  Administered 2016-07-12: 2 mg via INTRAVENOUS

## 2016-07-12 MED ORDER — DIPHENHYDRAMINE HCL 50 MG/ML IJ SOLN
INTRAMUSCULAR | Status: DC | PRN
  Administered 2016-07-12: 25 mg via INTRAVENOUS

## 2016-07-12 SURGICAL SUPPLY — 68 items
BANDAGE ACE 4X5 VEL STRL LF (GAUZE/BANDAGES/DRESSINGS) ×3 IMPLANT
BANDAGE ELASTIC 3 VELCRO ST LF (GAUZE/BANDAGES/DRESSINGS) ×3 IMPLANT
BIT DRILL 2.2 SS TIBIAL (BIT) ×3 IMPLANT
BNDG ESMARK 4X9 LF (GAUZE/BANDAGES/DRESSINGS) ×3 IMPLANT
BNDG GAUZE ELAST 4 BULKY (GAUZE/BANDAGES/DRESSINGS) ×6 IMPLANT
CORDS BIPOLAR (ELECTRODE) ×3 IMPLANT
COVER SURGICAL LIGHT HANDLE (MISCELLANEOUS) ×3 IMPLANT
CUFF TOURNIQUET SINGLE 18IN (TOURNIQUET CUFF) ×3 IMPLANT
DRAPE OEC MINIVIEW 54X84 (DRAPES) ×3 IMPLANT
DRAPE U-SHAPE 47X51 STRL (DRAPES) ×3 IMPLANT
DRSG ADAPTIC 3X8 NADH LF (GAUZE/BANDAGES/DRESSINGS) ×3 IMPLANT
GAUZE SPONGE 4X4 12PLY STRL (GAUZE/BANDAGES/DRESSINGS) ×3 IMPLANT
GAUZE XEROFORM 5X9 LF (GAUZE/BANDAGES/DRESSINGS) ×3 IMPLANT
GLOVE BIOGEL M 8.0 STRL (GLOVE) ×3 IMPLANT
GLOVE BIOGEL PI IND STRL 6.5 (GLOVE) ×1 IMPLANT
GLOVE BIOGEL PI IND STRL 7.5 (GLOVE) ×1 IMPLANT
GLOVE BIOGEL PI INDICATOR 6.5 (GLOVE) ×2
GLOVE BIOGEL PI INDICATOR 7.5 (GLOVE) ×2
GLOVE SS BIOGEL STRL SZ 8 (GLOVE) ×1 IMPLANT
GLOVE SUPERSENSE BIOGEL SZ 8 (GLOVE) ×2
GLOVE SURG SS PI 6.5 STRL IVOR (GLOVE) ×3 IMPLANT
GOWN STRL REUS W/ TWL LRG LVL3 (GOWN DISPOSABLE) ×4 IMPLANT
GOWN STRL REUS W/ TWL XL LVL3 (GOWN DISPOSABLE) ×3 IMPLANT
GOWN STRL REUS W/TWL LRG LVL3 (GOWN DISPOSABLE) ×8
GOWN STRL REUS W/TWL XL LVL3 (GOWN DISPOSABLE) ×6
K-WIRE 1.6 (WIRE) ×2
K-WIRE FX5X1.6XNS BN SS (WIRE) ×1
KIT BASIN OR (CUSTOM PROCEDURE TRAY) ×3 IMPLANT
KIT ROOM TURNOVER OR (KITS) ×3 IMPLANT
KWIRE FX5X1.6XNS BN SS (WIRE) ×1 IMPLANT
MANIFOLD NEPTUNE II (INSTRUMENTS) ×3 IMPLANT
NS IRRIG 1000ML POUR BTL (IV SOLUTION) ×3 IMPLANT
PACK ORTHO EXTREMITY (CUSTOM PROCEDURE TRAY) ×3 IMPLANT
PAD ARMBOARD 7.5X6 YLW CONV (MISCELLANEOUS) ×6 IMPLANT
PAD CAST 4YDX4 CTTN HI CHSV (CAST SUPPLIES) ×1 IMPLANT
PADDING CAST COTTON 4X4 STRL (CAST SUPPLIES) ×2
PEG LOCKING SMOOTH 2.2X18 (Peg) ×6 IMPLANT
PEG LOCKING SMOOTH 2.2X20 (Screw) ×12 IMPLANT
PEG LOCKING SMOOTH 2.2X22 (Screw) ×3 IMPLANT
PLATE STANDARD DVR LEFT (Plate) ×3 IMPLANT
PLATE STD DVR LT 24X51 (Plate) ×1 IMPLANT
PUTTY DBM STAGRAFT PLUS 10CC (Putty) ×3 IMPLANT
SCREW  LP NL 2.7X16MM (Screw) ×2 IMPLANT
SCREW LOCK 14X2.7X 3 LD TPR (Screw) ×2 IMPLANT
SCREW LOCK 20X2.7X 3 LD TPR (Screw) ×1 IMPLANT
SCREW LOCKING 2.7X13MM (Screw) ×3 IMPLANT
SCREW LOCKING 2.7X14 (Screw) ×4 IMPLANT
SCREW LOCKING 2.7X15MM (Screw) ×6 IMPLANT
SCREW LOCKING 2.7X20MM (Screw) ×2 IMPLANT
SCREW LP NL 2.7X16MM (Screw) ×1 IMPLANT
SCRUB BETADINE 4OZ XXX (MISCELLANEOUS) ×3 IMPLANT
SCRUB POVIDONE IODINE 4 OZ (MISCELLANEOUS) ×3 IMPLANT
SET CYSTO W/LG BORE CLAMP LF (SET/KITS/TRAYS/PACK) ×3 IMPLANT
SLING ARM FOAM STRAP LRG (SOFTGOODS) ×3 IMPLANT
SOL PREP POV-IOD 4OZ 10% (MISCELLANEOUS) ×3 IMPLANT
SPONGE LAP 4X18 X RAY DECT (DISPOSABLE) IMPLANT
SUT MNCRL AB 4-0 PS2 18 (SUTURE) IMPLANT
SUT PROLENE 3 0 PS 2 (SUTURE) IMPLANT
SUT PROLENE 4 0 PS 2 18 (SUTURE) ×6 IMPLANT
SUT VIC AB 3-0 FS2 27 (SUTURE) ×3 IMPLANT
SYR CONTROL 10ML LL (SYRINGE) IMPLANT
SYSTEM CHEST DRAIN TLS 7FR (DRAIN) ×3 IMPLANT
TOWEL OR 17X24 6PK STRL BLUE (TOWEL DISPOSABLE) ×3 IMPLANT
TUBE CONNECTING 12'X1/4 (SUCTIONS) ×1
TUBE CONNECTING 12X1/4 (SUCTIONS) ×2 IMPLANT
TUBE EVACUATION TLS (MISCELLANEOUS) ×3 IMPLANT
UNDERPAD 30X30 (UNDERPADS AND DIAPERS) ×3 IMPLANT
WATER STERILE IRR 1000ML POUR (IV SOLUTION) ×3 IMPLANT

## 2016-07-12 NOTE — H&P (Signed)
Tina Bauer is an 36 y.o. Okey Duprefemale.   Chief Complaint: left wrist fracture HPI: Patient presents for evaluation and treatment of the of their upper extremity predicament. The patient denies neck, back, chest or  abdominal pain. The patient notes that they have no lower extremity problems. The patients primary complaint is noted. We are planning surgical care pathway for the upper extremity.  Past Medical History:  Diagnosis Date  . Dyspnea    occasional due to contusion of abdoman    Past Surgical History:  Procedure Laterality Date  . NO PAST SURGERIES      History reviewed. No pertinent family history. Social History:  reports that she has been smoking Cigarettes.  She has a 3.75 pack-year smoking history. She has never used smokeless tobacco. She reports that she drinks alcohol. She reports that she does not use drugs.  Allergies:  Allergies  Allergen Reactions  . Fentanyl Itching    Medications Prior to Admission  Medication Sig Dispense Refill  . traMADol (ULTRAM) 50 MG tablet Take 100 mg by mouth every 4 (four) hours as needed for pain.  0  . HYDROcodone-acetaminophen (NORCO/VICODIN) 5-325 MG tablet Take 1-2 tablets by mouth every 6 (six) hours as needed (for pain; may cause constipation). (Patient not taking: Reported on 07/04/2016) 20 tablet 0    No results found for this or any previous visit (from the past 48 hour(s)). No results found.  Review of Systems  Constitutional: Negative.   Eyes: Negative.   Respiratory: Negative.   Cardiovascular: Negative.   Gastrointestinal: Negative.   Genitourinary: Negative.   Skin: Negative.     Blood pressure 122/80, pulse 82, temperature 98.2 F (36.8 C), temperature source Oral, resp. rate 18, height 5\' 7"  (1.702 m), weight 81.6 kg (180 lb), SpO2 100 %. Physical Exam left displaced wrist fracture The patient is alert and oriented in no acute distress. The patient complains of pain in the affected upper extremity.  The  patient is noted to have a normal HEENT exam. Lung fields show equal chest expansion and no shortness of breath. Abdomen exam is nontender without distention. Lower extremity examination does not show any fracture dislocation or blood clot symptoms. Pelvis is stable and the neck and back are stable and nontender.  Assessment/Plan We are planning surgery for your upper extremity. The risk and benefits of surgery to include risk of bleeding, infection, anesthesia,  damage to normal structures and failure of the surgery to accomplish its intended goals of relieving symptoms and restoring function have been discussed in detail. With this in mind we plan to proceed. I have specifically discussed with the patient the pre-and postoperative regime and the dos and don'ts and risk and benefits in great detail. Risk and benefits of surgery also include risk of dystrophy(CRPS), chronic nerve pain, failure of the healing process to go onto completion and other inherent risks of surgery The relavent the pathophysiology of the disease/injury process, as well as the alternatives for treatment and postoperative course of action has been discussed in great detail with the patient who desires to proceed.  We will do everything in our power to help you (the patient) restore function to the upper extremity. It is a pleasure to see this patient today.   Plan ORIF left wrist fracture  Karen ChafeGRAMIG III,Tina Bauer M, MD 07/12/2016, 4:15 PM

## 2016-07-12 NOTE — Anesthesia Procedure Notes (Signed)
Anesthesia Regional Block:  Supraclavicular block  Pre-Anesthetic Checklist: ,, timeout performed, Correct Patient, Correct Site, Correct Laterality, Correct Procedure, Correct Position, site marked, Risks and benefits discussed, Surgical consent,  Pre-op evaluation,  At surgeon's request  Laterality: Left  Prep: chloraprep       Needles:   Needle Type: Echogenic Stimulator Needle     Needle Length: 9cm 9 cm Needle Gauge: 22 and 22 G    Additional Needles:  Procedures: ultrasound guided (picture in chart) Supraclavicular block Narrative:  Start time: 07/12/2016 4:30 PM End time: 07/12/2016 4:35 PM Injection made incrementally with aspirations every 5 mL.  Performed by: Personally   Additional Notes: 30 cc 0.5% Ropivacaine injected easily

## 2016-07-12 NOTE — Discharge Instructions (Signed)

## 2016-07-12 NOTE — Anesthesia Preprocedure Evaluation (Signed)
Anesthesia Evaluation  Patient identified by MRN, date of birth, ID band Patient awake    Reviewed: Allergy & Precautions, NPO status , Patient's Chart, lab work & pertinent test results  Airway Mallampati: II  TM Distance: >3 FB Neck ROM: Full    Dental  (+) Teeth Intact, Dental Advisory Given   Pulmonary Current Smoker,    breath sounds clear to auscultation       Cardiovascular  Rhythm:Regular     Neuro/Psych    GI/Hepatic   Endo/Other    Renal/GU      Musculoskeletal   Abdominal   Peds  Hematology   Anesthesia Other Findings   Reproductive/Obstetrics                             Anesthesia Physical Anesthesia Plan  ASA: II  Anesthesia Plan: General and Regional   Post-op Pain Management:    Induction: Intravenous  Airway Management Planned: LMA  Additional Equipment:   Intra-op Plan:   Post-operative Plan:   Informed Consent: I have reviewed the patients History and Physical, chart, labs and discussed the procedure including the risks, benefits and alternatives for the proposed anesthesia with the patient or authorized representative who has indicated his/her understanding and acceptance.   Dental advisory given  Plan Discussed with: Anesthesiologist  Anesthesia Plan Comments:         Anesthesia Quick Evaluation

## 2016-07-12 NOTE — Progress Notes (Signed)
Pt ready for d/c to home at 1945- waiting on pain med script from Dr Amanda PeaGramig

## 2016-07-12 NOTE — Anesthesia Procedure Notes (Signed)
Procedure Name: LMA Insertion Date/Time: 07/12/2016 4:46 PM Performed by: Reine JustFLOWERS, Veneda Kirksey T Pre-anesthesia Checklist: Patient identified, Emergency Drugs available, Suction available, Patient being monitored and Timeout performed Patient Re-evaluated:Patient Re-evaluated prior to inductionOxygen Delivery Method: Circle system utilized and Simple face mask Preoxygenation: Pre-oxygenation with 100% oxygen Intubation Type: IV induction Ventilation: Mask ventilation without difficulty LMA: LMA inserted LMA Size: 5.0 Number of attempts: 1 Airway Equipment and Method: Patient positioned with wedge pillow Placement Confirmation: breath sounds checked- equal and bilateral and positive ETCO2 Tube secured with: Tape Dental Injury: Teeth and Oropharynx as per pre-operative assessment

## 2016-07-12 NOTE — Op Note (Signed)
See full dictation number [id number]  Status post ORIF radius fracture with Biomet plate and screw construct. Close treatment ulna styloid fracture. 4 view radiographic series.  Synethia Endicott M.D.

## 2016-07-12 NOTE — Transfer of Care (Signed)
Immediate Anesthesia Transfer of Care Note  Patient: Tina Bauer  Procedure(s) Performed: Procedure(s) with comments: ORIF left distal radius fracture with repair reconstruction as necessary, possible ORIF left ulna fracture (Left) - 90 mins  Patient Location: PACU  Anesthesia Type:GA combined with regional for post-op pain  Level of Consciousness: awake, alert  and oriented  Airway & Oxygen Therapy: Patient Spontanous Breathing and Patient connected to nasal cannula oxygen  Post-op Assessment: Report given to RN and Post -op Vital signs reviewed and stable  Post vital signs: Reviewed and stable  Last Vitals:  Vitals:   07/12/16 1816 07/12/16 1820  BP: 109/76   Pulse:    Resp: 20   Temp:  (P) 36.5 C    Last Pain:  Vitals:   07/12/16 1415  TempSrc: Oral      Patients Stated Pain Goal: 3 (07/12/16 1425)  Complications: No apparent anesthesia complications

## 2016-07-12 NOTE — Anesthesia Postprocedure Evaluation (Addendum)
Anesthesia Post Note  Patient: Tina Bauer  Procedure(s) Performed: Procedure(s) (LRB): ORIF left distal radius fracture with repair reconstruction as necessary, possible ORIF left ulna fracture (Left)  Patient location during evaluation: PACU Anesthesia Type: General and Regional Level of consciousness: awake and patient cooperative Pain management: pain level controlled Vital Signs Assessment: post-procedure vital signs reviewed and stable Respiratory status: spontaneous breathing, nonlabored ventilation and respiratory function stable Cardiovascular status: blood pressure returned to baseline Anesthetic complications: no       Last Vitals:  Vitals:   07/12/16 1816 07/12/16 1820  BP: 109/76   Pulse:    Resp: 20   Temp:  (P) 36.5 C    Last Pain:  Vitals:   07/12/16 1415  TempSrc: Oral                 [name]

## 2016-07-13 ENCOUNTER — Encounter (HOSPITAL_COMMUNITY): Payer: Self-pay | Admitting: Orthopedic Surgery

## 2016-07-13 NOTE — Op Note (Signed)
Tina Bauer, Tina Bauer            ACCOUNT NO.:  [id number]  MEDICAL RECORD NO.:  [id number]  LOCATION:                                 FACILITY:  PHYSICIAN:  Dionne Ano. Verlia Kaney, M.D.DATE OF BIRTH:  [birth year]  DATE OF PROCEDURE:07/12/2016 DATE OF DISCHARGE:TBD                              OPERATIVE REPORT   PREOPERATIVE DIAGNOSIS:  Comminuted complex intra-articular greater than 3-part distal radius fracture, left upper extremity  POSTOPERATIVE DIAGNOSIS:  Comminuted complex intra-articular greater than 3-part distal radius fracture, left upper extremity.  PROCEDURE: 1. Open reduction and internal fixation, distal radius fracture with     Biomet DVR plate and screw construct and Staygraft bone graft. 2. Radiographic series of the wrist. 3. Closed treatment ulnar styloid fracture.  SURGEON:  Dionne Ano. Amanda Pea, M.D.  ASSISTANT:  Karie Chimera, PA-C.  COMPLICATIONS:  None.  ANESTHESIA:  General with preoperative block.  TOURNIQUET TIME:  Less than an hour.  INDICATIONS:  The patient is pleasant female with displaced distal radius fracture.  She understands risks and benefits of surgery and desires to proceed.  All questions have been encouraged and answered preoperatively.  OPERATIVE PROCEDURE:  The patient was seen by myself and Anesthesia, taken to the operative theater, underwent smooth induction of anesthesia and following this was prepped and draped.  Hibiclens prescrub was performed by myself, followed by Betadine scrub and paint.  She had an LMA general anesthetic and a preoperative block.  Following securing time-out and antibiotics, the patient then underwent a very careful and cautious approach with volar radial incision about the left upper extremity.  Dissection was carried down.  FCR tendon sheath was incised. Carpal canal contents were retracted ulnarly.  Pronator was incised. Access to the fracture was gained.  The fracture was late in presentation due to  multiple issues including a GI sickness.  Thus, it was very difficult to mobilize.  I used a combination Therapist, nutritional and orthopedic device to perform careful reduction.  Following this, I packed Staygraft bone graft and the metaphyseal defect dorsally followed by placement of a plate and screw construct.  I used a K-wire for provisional fixation.  I was able to achieve adequate height tilt and inclination to my satisfaction.  A Biomet DVR plate was placed.  Screws were secured nicely.  It was a little bit more difficult than anticipated initially but we were able to mobilize the fragment to achieve a volar tilt that was acceptable.  Given the timeframe duration from injury to surgery this was somewhat of a takedown of a malunion/early healing process.  Nevertheless, we were successful getting her radiographic parameters restored.  Following this, we irrigated copiously, closed the pronator with Vicryl suture and had good coverage over the plate.  I then stress tested the distal radioulnar joint.  It was stable.  Despite having an ulnar styloid fracture, I felt the TFC demonstrated competency.  Thus, we performed closed treatment of the ulnar styloid fracture.  A 4B radiographic series was taken.  Tourniquet deflated.  Hemostasis secured.  Small drain placed and the wound closed with Prolene.  We will remove the drain in the recovery room.  Antibiotics and pain medicine were dispensed and all  questions have been encouraged and answered.  We will see her back in the office in 12-14 days.  We will place her in a cast and placed an Exogen bone stimulator on the affected area to accelerate healing.  These notes have been discussed.  All questions have been encouraged and answered.     Dionne AnoWilliam M. Amanda PeaGramig, M.D.   ______________________________ Dionne AnoWilliam M. Amanda PeaGramig, M.D.    [facility]  D:  07/12/2016  T:  07/13/2016  Job:  [id number]

## 2016-07-17 ENCOUNTER — Emergency Department (HOSPITAL_BASED_OUTPATIENT_CLINIC_OR_DEPARTMENT_OTHER)
Admission: EM | Admit: 2016-07-17 | Discharge: 2016-07-17 | Disposition: A | Discharge status: Home / Self Care | Payer: BLUE CROSS/BLUE SHIELD | Attending: Emergency Medicine | Admitting: Emergency Medicine

## 2016-07-17 ENCOUNTER — Encounter (HOSPITAL_BASED_OUTPATIENT_CLINIC_OR_DEPARTMENT_OTHER): Payer: Self-pay | Admitting: Emergency Medicine

## 2016-07-17 DIAGNOSIS — F17210 Nicotine dependence, cigarettes, uncomplicated: Secondary | ICD-10-CM | POA: Diagnosis not present

## 2016-07-17 DIAGNOSIS — R1030 Lower abdominal pain, unspecified: Secondary | ICD-10-CM

## 2016-07-17 DIAGNOSIS — Z79899 Other long term (current) drug therapy: Secondary | ICD-10-CM | POA: Insufficient documentation

## 2016-07-17 DIAGNOSIS — K5903 Drug induced constipation: Secondary | ICD-10-CM

## 2016-07-17 DIAGNOSIS — R109 Unspecified abdominal pain: Secondary | ICD-10-CM | POA: Diagnosis present

## 2016-07-17 DIAGNOSIS — T402X5A Adverse effect of other opioids, initial encounter: Secondary | ICD-10-CM

## 2016-07-17 DIAGNOSIS — K5909 Other constipation: Secondary | ICD-10-CM | POA: Diagnosis not present

## 2016-07-17 MED ORDER — BISACODYL 5 MG PO TBEC: 5.0000 mg | DELAYED_RELEASE_TABLET | Freq: Every day | ORAL | 0 refills | Status: DC | PRN

## 2016-07-17 MED ORDER — POLYETHYLENE GLYCOL 3350 17 G PO PACK: 17.0000 g | PACK | Freq: Every day | ORAL | 0 refills | Status: DC

## 2016-07-17 MED FILL — SM GENTLE LAXATIVE EC 5 MG: 5 MG | 100 days supply | Qty: 100 | Fill #0

## 2016-07-17 MED FILL — POLYETHYLENE GLYCOL 3350: 15 days supply | Qty: 255 | Fill #0
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
## 2016-07-17 NOTE — Discharge Instructions (Signed)
Please follow up with your doctor for further management of your condition.  Eat yogurt high in probiotic.  Wean yourself from opioid pain medication and continue taking Miralax to help with your constipation.
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
## 2016-07-19 ENCOUNTER — Emergency Department (HOSPITAL_BASED_OUTPATIENT_CLINIC_OR_DEPARTMENT_OTHER): Payer: BLUE CROSS/BLUE SHIELD

## 2016-07-19 ENCOUNTER — Encounter (HOSPITAL_BASED_OUTPATIENT_CLINIC_OR_DEPARTMENT_OTHER): Payer: Self-pay | Admitting: *Deleted

## 2016-07-19 ENCOUNTER — Emergency Department (HOSPITAL_BASED_OUTPATIENT_CLINIC_OR_DEPARTMENT_OTHER)
Admission: EM | Admit: 2016-07-19 | Discharge: 2016-07-19 | Disposition: A | Discharge status: Home / Self Care | Payer: BLUE CROSS/BLUE SHIELD | Attending: Emergency Medicine | Admitting: Emergency Medicine

## 2016-07-19 DIAGNOSIS — R1012 Left upper quadrant pain: Secondary | ICD-10-CM

## 2016-07-19 DIAGNOSIS — F17210 Nicotine dependence, cigarettes, uncomplicated: Secondary | ICD-10-CM | POA: Diagnosis not present

## 2016-07-19 DIAGNOSIS — K529 Noninfective gastroenteritis and colitis, unspecified: Secondary | ICD-10-CM | POA: Insufficient documentation

## 2016-07-19 DIAGNOSIS — R109 Unspecified abdominal pain: Secondary | ICD-10-CM

## 2016-07-19 LAB — LIPASE, BLOOD: Lipase: 13 U/L (ref 11–51)

## 2016-07-19 LAB — COMPREHENSIVE METABOLIC PANEL
ALT: 10 U/L — ABNORMAL LOW (ref 14–54)
ANION GAP: 7 (ref 5–15)
AST: 14 U/L — ABNORMAL LOW (ref 15–41)
Albumin: 3.8 g/dL (ref 3.5–5.0)
Alkaline Phosphatase: 84 U/L (ref 38–126)
BUN: 8 mg/dL (ref 6–20)
CALCIUM: 9.3 mg/dL (ref 8.9–10.3)
CHLORIDE: 102 mmol/L (ref 101–111)
CO2: 28 mmol/L (ref 22–32)
Creatinine, Ser: 0.84 mg/dL (ref 0.44–1.00)
GFR calc non Af Amer: >60 mL/min (ref >60)
Glucose, Bld: 87 mg/dL (ref 65–99)
Potassium: 3.6 mmol/L (ref 3.5–5.1)
SODIUM: 137 mmol/L (ref 135–145)
Total Bilirubin: 0.4 mg/dL (ref 0.3–1.2)
Total Protein: 7.1 g/dL (ref 6.5–8.1)

## 2016-07-19 LAB — CBC WITH DIFFERENTIAL/PLATELET
Basophils Absolute: 0 10*3/uL (ref 0.0–0.1)
Basophils Relative: 0 %
EOS PCT: 3 %
Eosinophils Absolute: 0.3 10*3/uL (ref 0.0–0.7)
HEMATOCRIT: 42 % (ref 36.0–46.0)
Hemoglobin: 14.5 g/dL (ref 12.0–15.0)
Lymphocytes Relative: 33 %
Lymphs Abs: 3.2 10*3/uL (ref 0.7–4.0)
MCH: 32 pg (ref 26.0–34.0)
MCHC: 34.5 g/dL (ref 30.0–36.0)
MCV: 92.7 fL (ref 78.0–100.0)
MONO ABS: 1.2 10*3/uL — AB (ref 0.1–1.0)
MONOS PCT: 13 %
NEUTROS PCT: 51 %
Neutro Abs: 4.9 10*3/uL (ref 1.7–7.7)
Platelets: 319 10*3/uL (ref 150–400)
RBC: 4.53 MIL/uL (ref 3.87–5.11)
RDW: 12.3 % (ref 11.5–15.5)
WBC: 9.6 10*3/uL (ref 4.0–10.5)

## 2016-07-19 LAB — URINALYSIS, ROUTINE W REFLEX MICROSCOPIC
BILIRUBIN URINE: NEGATIVE
Glucose, UA: NEGATIVE mg/dL
Hgb urine dipstick: NEGATIVE
KETONES UR: NEGATIVE mg/dL
Leukocytes, UA: NEGATIVE
NITRITE: NEGATIVE
PROTEIN: NEGATIVE mg/dL
Specific Gravity, Urine: 1.017 (ref 1.005–1.030)
pH: 8 (ref 5.0–8.0)

## 2016-07-19 LAB — PREGNANCY, URINE: PREG TEST UR: NEGATIVE

## 2016-07-19 MED ORDER — IOPAMIDOL (ISOVUE-300) INJECTION 61%
100.0000 mL | Freq: Once | INTRAVENOUS | Status: AC | PRN
  Administered 2016-07-19: 100 mL via INTRAVENOUS

## 2016-07-19 MED ORDER — SODIUM CHLORIDE 0.9 % IV SOLN: INTRAVENOUS | Status: DC

## 2016-07-19 MED ORDER — ONDANSETRON HCL 4 MG PO TABS: 4.0000 mg | ORAL_TABLET | Freq: Three times a day (TID) | ORAL | 0 refills | Status: DC | PRN

## 2016-07-19 MED ORDER — ONDANSETRON HCL 4 MG/2ML IJ SOLN
4.0000 mg | Freq: Once | INTRAMUSCULAR | Status: AC
  Administered 2016-07-19: 4 mg via INTRAVENOUS
  Filled 2016-07-19: qty 2

## 2016-07-19 MED ORDER — METRONIDAZOLE 500 MG PO TABS: 500.0000 mg | ORAL_TABLET | Freq: Three times a day (TID) | ORAL | 0 refills | Status: AC

## 2016-07-19 MED ORDER — CIPROFLOXACIN HCL 500 MG PO TABS: 500.0000 mg | ORAL_TABLET | Freq: Two times a day (BID) | ORAL | 0 refills | Status: AC

## 2016-07-19 MED ORDER — SODIUM CHLORIDE 0.9 % IV BOLUS (SEPSIS)
500.0000 mL | Freq: Once | INTRAVENOUS | Status: AC
  Administered 2016-07-19: 500 mL via INTRAVENOUS

## 2016-07-19 MED ORDER — IOPAMIDOL (ISOVUE-M 300) INJECTION 61%: 15.0000 mL | Freq: Once | INTRAMUSCULAR | Status: DC | PRN

## 2016-07-19 MED ORDER — HYDROMORPHONE HCL 1 MG/ML IJ SOLN
1.0000 mg | Freq: Once | INTRAMUSCULAR | Status: AC
  Administered 2016-07-19: 1 mg via INTRAVENOUS
  Filled 2016-07-19: qty 1

## 2016-07-19 MED FILL — ONDANSETRON HCL 4 MG TABLET: 4 | 4 days supply | Qty: 12 | Fill #0

## 2016-07-19 MED FILL — CIPROFLOXACIN HCL 500 MG TA: 500 | 10 days supply | Qty: 20 | Fill #0

## 2016-07-19 MED FILL — metroNIDAZOLE 500 MG TABS: 500 | 10 days supply | Qty: 30 | Fill #0
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
## 2016-07-19 NOTE — Discharge Instructions (Signed)
You likely have inflammation and irritation of your GI tract. Please take the antibiotics to help clear up the enteritis. Please take the pain medicine and nausea medicine as needed for your symptoms. Please stay hydrated. If symptoms worsen, please return to the nearest emergency department for further management.

## 2016-07-21 LAB — PATHOLOGIST SMEAR REVIEW

## 2017-01-31 NOTE — Addendum Note (Signed)
Addendum  created 01/31/17 1030 by Deva Ron, MD   Sign clinical note    

## 2017-04-21 ENCOUNTER — Emergency Department (HOSPITAL_BASED_OUTPATIENT_CLINIC_OR_DEPARTMENT_OTHER)
Admission: EM | Admit: 2017-04-21 | Discharge: 2017-04-22 | Disposition: A | Discharge status: Home / Self Care | Payer: BLUE CROSS/BLUE SHIELD | Attending: Emergency Medicine | Admitting: Emergency Medicine

## 2017-04-21 ENCOUNTER — Other Ambulatory Visit: Payer: Self-pay

## 2017-04-21 ENCOUNTER — Encounter (HOSPITAL_BASED_OUTPATIENT_CLINIC_OR_DEPARTMENT_OTHER): Payer: Self-pay | Admitting: Emergency Medicine

## 2017-04-21 ENCOUNTER — Emergency Department (HOSPITAL_BASED_OUTPATIENT_CLINIC_OR_DEPARTMENT_OTHER): Payer: BLUE CROSS/BLUE SHIELD

## 2017-04-21 DIAGNOSIS — R1033 Periumbilical pain: Secondary | ICD-10-CM | POA: Diagnosis present

## 2017-04-21 DIAGNOSIS — F17210 Nicotine dependence, cigarettes, uncomplicated: Secondary | ICD-10-CM | POA: Insufficient documentation

## 2017-04-21 DIAGNOSIS — Z79899 Other long term (current) drug therapy: Secondary | ICD-10-CM | POA: Insufficient documentation

## 2017-04-21 DIAGNOSIS — K429 Umbilical hernia without obstruction or gangrene: Secondary | ICD-10-CM | POA: Diagnosis not present

## 2017-04-21 HISTORY — DX: Umbilical hernia without obstruction or gangrene: K42.9

## 2017-04-21 LAB — CBC WITH DIFFERENTIAL/PLATELET
Basophils Absolute: 0 10*3/uL (ref 0.0–0.1)
Basophils Relative: 0 %
Eosinophils Absolute: 0.5 10*3/uL (ref 0.0–0.7)
Eosinophils Relative: 5 %
HCT: 35.9 % — ABNORMAL LOW (ref 36.0–46.0)
Hemoglobin: 12.3 g/dL (ref 12.0–15.0)
Lymphocytes Relative: 44 %
Lymphs Abs: 4.2 10*3/uL — ABNORMAL HIGH (ref 0.7–4.0)
MCH: 32.2 pg (ref 26.0–34.0)
MCHC: 34.3 g/dL (ref 30.0–36.0)
MCV: 94 fL (ref 78.0–100.0)
Monocytes Absolute: 0.8 10*3/uL (ref 0.1–1.0)
Monocytes Relative: 8 %
Neutro Abs: 4.1 10*3/uL (ref 1.7–7.7)
Neutrophils Relative %: 43 %
Platelets: 267 10*3/uL (ref 150–400)
RBC: 3.82 MIL/uL — ABNORMAL LOW (ref 3.87–5.11)
RDW: 12.5 % (ref 11.5–15.5)
WBC: 9.6 10*3/uL (ref 4.0–10.5)

## 2017-04-21 LAB — URINALYSIS, ROUTINE W REFLEX MICROSCOPIC
Bilirubin Urine: NEGATIVE
Glucose, UA: NEGATIVE mg/dL
Hgb urine dipstick: NEGATIVE
Ketones, ur: NEGATIVE mg/dL
Leukocytes, UA: NEGATIVE
Nitrite: NEGATIVE
Protein, ur: NEGATIVE mg/dL
Specific Gravity, Urine: 1.025 (ref 1.005–1.030)
pH: 6.5 (ref 5.0–8.0)

## 2017-04-21 LAB — COMPREHENSIVE METABOLIC PANEL
ALT: 18 U/L (ref 14–54)
AST: 18 U/L (ref 15–41)
Albumin: 3.8 g/dL (ref 3.5–5.0)
Alkaline Phosphatase: 70 U/L (ref 38–126)
Anion gap: 6 (ref 5–15)
BUN: 11 mg/dL (ref 6–20)
CO2: 21 mmol/L — ABNORMAL LOW (ref 22–32)
Calcium: 8.7 mg/dL — ABNORMAL LOW (ref 8.9–10.3)
Chloride: 109 mmol/L (ref 101–111)
Creatinine, Ser: 0.69 mg/dL (ref 0.44–1.00)
GFR calc Af Amer: >60 mL/min (ref >60)
GFR calc non Af Amer: >60 mL/min (ref >60)
Glucose, Bld: 118 mg/dL — ABNORMAL HIGH (ref 65–99)
Potassium: 3.3 mmol/L — ABNORMAL LOW (ref 3.5–5.1)
Sodium: 136 mmol/L (ref 135–145)
Total Bilirubin: 0.2 mg/dL — ABNORMAL LOW (ref 0.3–1.2)
Total Protein: 6.9 g/dL (ref 6.5–8.1)

## 2017-04-21 LAB — PREGNANCY, URINE: Preg Test, Ur: NEGATIVE

## 2017-04-21 MED ORDER — SODIUM CHLORIDE 0.9 % IV BOLUS (SEPSIS)
1000.0000 mL | Freq: Once | INTRAVENOUS | Status: AC
  Administered 2017-04-21: 1000 mL via INTRAVENOUS

## 2017-04-21 MED ORDER — KETOROLAC TROMETHAMINE 15 MG/ML IJ SOLN
15.0000 mg | Freq: Once | INTRAMUSCULAR | Status: AC
  Administered 2017-04-21: 15 mg via INTRAVENOUS
  Filled 2017-04-21: qty 1

## 2017-04-21 MED ORDER — IOPAMIDOL (ISOVUE-300) INJECTION 61%
100.0000 mL | Freq: Once | INTRAVENOUS | Status: AC | PRN
  Administered 2017-04-21: 100 mL via INTRAVENOUS

## 2017-04-21 MED ORDER — HYDROMORPHONE HCL 1 MG/ML IJ SOLN
1.0000 mg | Freq: Once | INTRAMUSCULAR | Status: AC
  Administered 2017-04-21: 1 mg via INTRAVENOUS
  Filled 2017-04-21: qty 1

## 2017-04-21 MED ORDER — OXYCODONE-ACETAMINOPHEN 5-325 MG PO TABS: 1.0000 | ORAL_TABLET | ORAL | 0 refills | Status: DC | PRN

## 2017-04-21 MED ORDER — ONDANSETRON HCL 4 MG PO TABS: 4.0000 mg | ORAL_TABLET | Freq: Four times a day (QID) | ORAL | 0 refills | Status: DC

## 2017-04-21 MED ORDER — LORAZEPAM 2 MG/ML IJ SOLN
0.5000 mg | Freq: Once | INTRAMUSCULAR | Status: AC
  Administered 2017-04-21: 0.5 mg via INTRAVENOUS
  Filled 2017-04-21: qty 1

## 2017-04-21 NOTE — Discharge Instructions (Signed)
[facility] Return to the ER if symptoms worsen before then. I would [facility] because [facility] is available at those sites.

## 2017-04-21 NOTE — ED Notes (Signed)
Oral contrast given at 7:03; scan to be done at 8:03

## 2017-04-21 NOTE — ED Triage Notes (Signed)
Abd pain near her umbilical hernia x 1 week.

## 2017-04-21 NOTE — ED Provider Notes (Signed)
[facility] EMERGENCY DEPARTMENT Provider Note   CSN: [id number] Arrival date & time: 04/21/17  1704     History   Chief Complaint Chief Complaint  Patient presents with  . Abdominal Pain    HPI Tina Bauer is a 36 y.o. female.  HPI   36 year old female with abdominal pain.  Periumbilical.  She reports a prior history of umbilical hernia.  The past week she has had persistent pain.  No fevers or chills.  No nausea or vomiting. No urinary complaints.  Past Medical History:  Diagnosis Date  . Dyspnea    occasional due to contusion of abdoman  . Hernia, umbilical     There are no active problems to display for this patient.   History reviewed. No pertinent surgical history.  OB History    No data available       Home Medications    Prior to Admission medications   Medication Sig Start Date End Date Taking? Authorizing Provider  Ascorbic Acid (VITAMIN C) 100 MG tablet Take 100 mg by mouth daily.    [provider]  bisacodyl (DULCOLAX) 5 MG EC tablet Take 1 tablet (5 mg total) by mouth daily as needed for moderate constipation. 07/17/16   Fayrene Helper, PA-C  methocarbamol (ROBAXIN) 500 MG tablet Take 500 mg by mouth 4 (four) times daily.    [provider]  ondansetron (ZOFRAN) 4 MG tablet Take 1 tablet (4 mg total) by mouth every 8 (eight) hours as needed for nausea or vomiting. 07/19/16   Tegeler, Canary Brim, MD  oxyCODONE (OXY IR/ROXICODONE) 5 MG immediate release tablet Take 1 tablet (5 mg total) by mouth once as needed (for pain score of 1-4). 07/12/16   Karie Chimera, PA-C  polyethylene glycol [facility] / GLYCOLAX) packet Take 17 g by mouth daily. 07/17/16   Fayrene Helper, PA-C  sulfamethoxazole-trimethoprim (BACTRIM DS,SEPTRA DS) 800-160 MG tablet Take 1 tablet by mouth 2 (two) times daily. 07/12/16   Karie Chimera, PA-C    Family History No family history on file.  Social History Social History   Tobacco Use  . Smoking  status: Current Every Day Smoker    Packs/day: 0.25    Years: 15.00    Pack years: 3.75    Types: Cigarettes  . Smokeless tobacco: Never Used  . Tobacco comment: has cut back only about 3 cig a day  Substance Use Topics  . Alcohol use: Yes    Comment: ocassionally  . Drug use: No     Allergies   Fentanyl   Review of Systems Review of Systems  All systems reviewed and negative, other than as noted in HPI.  Physical Exam Updated Vital Signs BP 109/76 (BP Location: Left Arm)   Pulse 70   Temp 98.5 F (36.9 C) (Oral)   Resp 14   Ht 5\' 4"  (1.626 m)   Wt 89.4 kg (197 lb)   LMP 04/17/2017   SpO2 97%   BMI 33.81 kg/m   Physical Exam  Constitutional: She appears well-developed and well-nourished. No distress.  HENT:  Head: Normocephalic and atraumatic.  Eyes: Conjunctivae are normal. Right eye exhibits no discharge. Left eye exhibits no discharge.  Neck: Neck supple.  Cardiovascular: Normal rate, regular rhythm and normal heart sounds. Exam reveals no gallop and no friction rub.  No murmur heard. Pulmonary/Chest: Effort normal and breath sounds normal. No respiratory distress.  Abdominal: Soft. She exhibits no distension. There is tenderness.  Around the umbilicus.  Possible small  hernia just superior and left lateral to the umbilicus.  If so, cannot reduce.  No overlying skin changes.  Does not seem distended.  No peritonitis.  Musculoskeletal: She exhibits no edema or tenderness.  Neurological: She is alert.  Skin: Skin is warm and dry.  Psychiatric: She has a normal mood and affect. Her behavior is normal. Thought content normal.  Nursing note and vitals reviewed.    ED Treatments / Results  Labs (all labs ordered are listed, but only abnormal results are displayed) Labs Reviewed  CBC WITH DIFFERENTIAL/PLATELET - Abnormal; Notable for the following components:      Result Value   RBC 3.82 (*)    HCT 35.9 (*)    Lymphs Abs 4.2 (*)    All other components  within normal limits  COMPREHENSIVE METABOLIC PANEL - Abnormal; Notable for the following components:   Potassium 3.3 (*)    CO2 21 (*)    Glucose, Bld 118 (*)    Calcium 8.7 (*)    Total Bilirubin 0.2 (*)    All other components within normal limits  URINALYSIS, ROUTINE W REFLEX MICROSCOPIC  PREGNANCY, URINE    EKG  EKG Interpretation None       Radiology Ct Abdomen Pelvis W Contrast  Result Date: 04/21/2017 CLINICAL DATA:  36 year old female with abdominal pain. History of mid abdominal hernia with worsening of the symptoms over the hernia. EXAM: CT ABDOMEN AND PELVIS WITH CONTRAST TECHNIQUE: Multidetector CT imaging of the abdomen and pelvis was performed using the standard protocol following bolus administration of intravenous contrast. CONTRAST:  100mL ISOVUE-300 IOPAMIDOL (ISOVUE-300) INJECTION 61% COMPARISON:  CT dated 08/15/2016 FINDINGS: Lower chest: There bibasilar atelectatic changes/scarring. A faint 4 mm ground-glass nodular density in the right lung (series 4, image 1) is only partially visualized but may represent a nodule. There is no intra-abdominal free air or free fluid. Hepatobiliary: No focal liver abnormality is seen. No gallstones, gallbladder wall thickening, or biliary dilatation. Pancreas: Unremarkable. No pancreatic ductal dilatation or surrounding inflammatory changes. Spleen: Normal in size without focal abnormality. Adrenals/Urinary Tract: The adrenal glands are unremarkable. The kidneys, visualized ureters, and urinary bladder appear unremarkable. Stomach/Bowel: There is moderate amount of stool throughout the colon. There is no evidence of bowel obstruction or active inflammation. Normal appendix. Vascular/Lymphatic: No significant vascular findings are present. No enlarged abdominal or pelvic lymph nodes. Reproductive: The uterus is anteverted. There probable small posterior uterine fibroids. The left ovary is unremarkable. There is a 4.7 x 3.6 cm cystic  structure in the region of the right adnexa similar to prior CT of 08/15/2016. This likely represents a para ovarian or paratubal cyst versus a mesenteric inclusion cyst given stability in size compared to the prior CT. A tampon is noted in the vagina. Other: There are multiple small stones supraumbilical fat containing hernias as seen on the prior CT. There is probable umbilical hernia repair mesh. There is inflammatory changes and induration of the umbilical fat and periumbilical subcutaneous fat. This is new compared to the prior CT. No definite umbilical hernia identified. The inflammatory changes may be reactive or infectious in etiology however, strangulation of a small amount of fat in the umbilicus is not entirely excluded. Correlation with clinical exam is recommended. There is no drainable fluid collection or abscess. There is a 5.4 x 3.2 cm (previously 7.9 x 5.0 cm) fluid collection in the left anterolateral pelvic wall at the level of the iliac crest. This fluid collection is indeterminate but may represent  a postsurgical seroma. Musculoskeletal: No acute or significant osseous findings. IMPRESSION: 1. Inflammatory changes of the umbilicus and periumbilical subcutaneous soft fat. This findings may be reactive or infectious in etiology. However, strangulation of umbilical fat is not entirely excluded. No umbilical hernia noted. No fluid collection or abscess. 2. Decrease in the size of the fluid collection in the left anterolateral pelvic wall, likely a postoperative seroma. 3. Right adnexal cystic lesion, similar to prior CT, and likely a paraovarian or paratubal cyst versus a peritoneal inclusion cyst. Electronically Signed   By: Elgie CollardArash  Radparvar M.D.   On: 04/21/2017 21:24    Procedures Procedures (including critical care time)  Medications Ordered in ED Medications  sodium chloride 0.9 % bolus 1,000 mL (1,000 mLs Intravenous New Bag/Given 04/21/17 1914)  HYDROmorphone (DILAUDID) injection 1  mg (1 mg Intravenous Given 04/21/17 1912)  ketorolac (TORADOL) 15 MG/ML injection 15 mg (15 mg Intravenous Given 04/21/17 1911)  LORazepam (ATIVAN) injection 0.5 mg (0.5 mg Intravenous Given 04/21/17 1911)  iopamidol (ISOVUE-300) 61 % injection 100 mL (100 mLs Intravenous Contrast Given 04/21/17 2011)  HYDROmorphone (DILAUDID) injection 1 mg (1 mg Intravenous Given 04/21/17 2057)     Initial Impression / Assessment and Plan / ED Course  I have reviewed the triage vital signs and the nursing notes.  Pertinent labs & imaging results that were available during my care of the patient were reviewed by me and considered in my medical decision making (see chart for details).     36 year old female with periumbilical pain.  CT as above.  I cannot say I clearly appreciate a hernia on physical exam.  She is afebrile.  No leukocytosis.  There are no overlying skin changes.  Discussed case with Dr. Donell BeersByerly, general surgery.  Patient was given the option of admission but likely would not be able to have surgery for a couple days versus close outpatient follow-up with immediate return precautions.  She is electing to follow-up.  She will be prescribed pain and nausea medicine as needed.  She is to call the [facility] surgery office in the morning scheduled time to be seen.  Advised that [facility] if she needs to as we do not have surgical capabilities [facility].  Final Clinical Impressions(s) / ED Diagnoses   Final diagnoses:  Umbilical hernia without obstruction and without gangrene    ED Discharge Orders    None       Raeford RazorKohut, Rowan Blaker, MD 04/30/17 [phone]

## 2017-04-22 ENCOUNTER — Ambulatory Visit: Payer: Self-pay | Admitting: Surgery

## 2017-05-13 ENCOUNTER — Encounter (HOSPITAL_COMMUNITY): Payer: Self-pay | Admitting: Surgery

## 2017-05-13 DIAGNOSIS — K436 Other and unspecified ventral hernia with obstruction, without gangrene: Secondary | ICD-10-CM | POA: Diagnosis present

## 2017-05-13 NOTE — H&P (Signed)
General Surgery [facility]  Tina Bauer DOB: [birth year] Single / Language: Lenox Ponds[place] / Race: Black or African American Female   History of Present Illness  The patient is a 36 year old female who presents with an abdominal wall hernia.  CC: ventral hernia, symptomatic  Patient presents on referral from Dr. Baldemar FridaySteven Kohut for evaluation of symptomatic umbilical or ventral hernia. Patient first noted a hernia 13 years ago after pregnancy. This remained largely asymptomatic until approximately 5-6 years ago when the patient began having intermittent pain. Over the past 1-1/2 weeks she has noted marked increase in pain and increase in size. She was seen at [facility] yesterday and a CT scan of the abdomen and pelvis was obtained which has been reviewed. Patient has had no prior abdominal surgery. She has had no prior hernia repairs. She denies any signs or symptoms of intestinal obstruction. She continues to have discomfort around the area of the umbilicus. She presents today for surgical assessment.   Diagnostic Studies History Colonoscopy  never Mammogram  within last year Pap Smear  1-5 years ago  Allergies FentaNYL *ANALGESICS - OPIOID*  Itching. Allergies Reconciled   Medication History Percocet (5-325MG  Tablet, Oral) Active. Zofran (4MG  Tablet, Oral) Active. Vitamin C (100MG  Tablet, Oral) Active. Medications Reconciled  Social History Alcohol use  Occasional alcohol use. Caffeine use  Carbonated beverages, Coffee, Tea. Illicit drug use  Remotely quit drug use. Tobacco use  Current every day smoker.  Family History Arthritis  Mother. Breast Cancer  Family Members In General, Mother. Cerebrovascular Accident  Father. Hypertension  Father, Mother.  Pregnancy / Birth History Age at menarche  12 years. Contraceptive History  Depo-provera. Gravida  3 Maternal age  35-25 Para  3 Regular periods   Other  Problems  Umbilical Hernia Repair    Review of Systems  General Not Present- Appetite Loss, Chills, Fatigue, Fever, Night Sweats, Weight Gain and Weight Loss. Skin Not Present- Change in Wart/Mole, Dryness, Hives, Jaundice, New Lesions, Non-Healing Wounds, Rash and Ulcer. HEENT Not Present- Earache, Hearing Loss, Hoarseness, Nose Bleed, Oral Ulcers, Ringing in the Ears, Seasonal Allergies, Sinus Pain, Sore Throat, Visual Disturbances, Wears glasses/contact lenses and Yellow Eyes. Breast Not Present- Breast Mass, Breast Pain, Nipple Discharge and Skin Changes. Cardiovascular Not Present- Chest Pain, Difficulty Breathing Lying Down, Leg Cramps, Palpitations, Rapid Heart Rate, Shortness of Breath and Swelling of Extremities. Gastrointestinal Present- Abdominal Pain, Bloating, Constipation, Hemorrhoids, Nausea and Vomiting. Not Present- Bloody Stool, Change in Bowel Habits, Chronic diarrhea, Difficulty Swallowing, Excessive gas, Gets full quickly at meals, Indigestion and Rectal Pain. Female Genitourinary Not Present- Frequency, Nocturia, Painful Urination, Pelvic Pain and Urgency. Musculoskeletal Present- Swelling of Extremities. Not Present- Back Pain, Joint Pain, Joint Stiffness, Muscle Pain and Muscle Weakness. Neurological Not Present- Decreased Memory, Fainting, Headaches, Numbness, Seizures, Tingling, Tremor, Trouble walking and Weakness. Psychiatric Not Present- Anxiety, Bipolar, Change in Sleep Pattern, Depression, Fearful and Frequent crying. Endocrine Not Present- Cold Intolerance, Excessive Hunger, Hair Changes, Heat Intolerance, Hot flashes and New Diabetes. Hematology Not Present- Blood Thinners, Easy Bruising, Excessive bleeding, Gland problems, HIV and Persistent Infections.  Vitals Weight: 196.2 lb Height: 68in Body Surface Area: 2.03 m Body Mass Index: 29.83 kg/m  Temp.: 98.29F(Oral)  Pulse: 52 (Regular)  BP: 128/90 (Sitting, Left Arm, Standard)   Physical  Exam  See vital signs recorded above  GENERAL APPEARANCE Development: normal Nutritional status: normal Gross deformities: none  SKIN Rash, lesions, ulcers: none Induration, erythema: none  Nodules: none palpable  EYES Conjunctiva and lids: normal Pupils: equal and reactive Iris: normal bilaterally  EARS, NOSE, MOUTH, THROAT External ears: no lesion or deformity External nose: no lesion or deformity Hearing: grossly normal Lips: no lesion or deformity Dentition: normal for age Oral mucosa: moist  NECK Symmetric: yes Trachea: midline Thyroid: no palpable nodules in the thyroid bed  CHEST Respiratory effort: normal Retraction or accessory muscle use: no Breath sounds: normal bilaterally Rales, rhonchi, wheeze: none  CARDIOVASCULAR Auscultation: regular rhythm, normal rate Murmurs: none Pulses: carotid and radial pulse 2+ palpable Lower extremity edema: none Lower extremity varicosities: none  ABDOMEN Distension: none Masses: none palpable Tenderness: At site of probable hernia Hepatosplenomegaly: not present Hernia: Palpable soft tissue mass just above and to the left of the umbilicus, augments with Valsalva, not fully reducible, most likely representing an incarcerated omentum in a ventral or umbilical hernia. Fascial defect is not palpable. Moderate tenderness to palpation.  MUSCULOSKELETAL Station and gait: normal Digits and nails: no clubbing or cyanosis Muscle strength: grossly normal all extremities Range of motion: grossly normal all extremities Deformity: none  LYMPHATIC Cervical: none palpable Supraclavicular: none palpable  PSYCHIATRIC Oriented to person, place, and time: yes Mood and affect: normal for situation Judgment and insight: appropriate for situation    Assessment & Plan  INCARCERATED VENTRAL HERNIA (K46.0)  Patient presents on referral from the emergency department for evaluation of symptomatic incarcerated ventral or  umbilical hernia. Patient is given written literature on hernia surgery to review at home.  Patient has a long-standing ventral or umbilical hernia since her pregnancy 13 years ago. This has become progressively larger and more symptomatic. Over the past week and a half she has had significant discomfort. CT scan of abdomen and pelvis from last night shows inflammatory changes. There does not appear to be any incarcerated bowel but likely incarcerated omentum is present.  Patient discussed options for management. I would recommend open repair using mesh. We discussed the risk and benefits of surgery including the potential for recurrence being approximately 5%. We discussed restrictions on her activities after the surgery. She understands and wishes to proceed in the near future.  The risks and benefits of the procedure have been discussed at length with the patient. The patient understands the proposed procedure, potential alternative treatments, and the course of recovery to be expected. All of the patient's questions have been answered at this time. The patient wishes to proceed with surgery.  Darnell Levelodd Mikko Lewellen, MD [facility] Office: [phone]

## 2017-05-14 ENCOUNTER — Other Ambulatory Visit: Payer: Self-pay

## 2017-05-14 ENCOUNTER — Encounter (HOSPITAL_COMMUNITY): Payer: Self-pay

## 2017-05-14 ENCOUNTER — Encounter (HOSPITAL_COMMUNITY)
Admission: RE | Admit: 2017-05-14 | Discharge: 2017-05-14 | Disposition: A | Discharge status: Home / Self Care | Payer: BLUE CROSS/BLUE SHIELD | Source: Ambulatory Visit | Attending: Surgery | Admitting: Surgery

## 2017-05-14 DIAGNOSIS — F17200 Nicotine dependence, unspecified, uncomplicated: Secondary | ICD-10-CM | POA: Diagnosis not present

## 2017-05-14 DIAGNOSIS — K436 Other and unspecified ventral hernia with obstruction, without gangrene: Secondary | ICD-10-CM | POA: Diagnosis not present

## 2017-05-14 DIAGNOSIS — Z79899 Other long term (current) drug therapy: Secondary | ICD-10-CM | POA: Diagnosis not present

## 2017-05-14 DIAGNOSIS — N803 Endometriosis of pelvic peritoneum: Secondary | ICD-10-CM | POA: Diagnosis not present

## 2017-05-14 HISTORY — DX: Headache, unspecified: R51.9

## 2017-05-14 HISTORY — DX: Headache: R51

## 2017-05-14 NOTE — Patient Instructions (Addendum)
Tina BoraCrystal Bauer  05/14/2017   Your procedure is scheduled on: Thursday 05-16-17  [facility]  Entrance [place]  elevators to 3rd floor to  Short Stay Center at 1230 PM.   Call this number if you have problems the morning of surgery [phone]    Remember: ONLY 1 PERSON MAY GO WITH YOU TO SHORT STAY TO GET  READY MORNING OF YOUR SURGERY.  Do not eat food :After Midnight.CLEAR LIQUIDS FROM MIDNIGHT UNTIL 830 AM DAY OF SURGERY, THEN NOTHING BY MOUTH AFTER 830 AM DAY OF SURGEYR.      Take these medicines the morning of surgery with A SIP OF WATER: NONE             You may not have any metal on your body including hair pins and              piercings  Do not wear jewelry, make-up, lotions, powders or perfumes, deodorant             Do not wear nail polish.  Do not shave  48 hours prior to surgery.              Men may shave face and neck.   Do not bring valuables to the [facility]. [facility] IS NOT             RESPONSIBLE   FOR VALUABLES.  Contacts, dentures or bridgework may not be worn into surgery.  Leave suitcase in the car. After surgery it may be brought to your room.     Patients discharged the day of surgery will not be allowed to drive home.  Name and phone number of your driver: Tina MagicCYNTHIA [facility] [phone]  Special Instructions: N/A              Please read over the following fact sheets you were given: _____________________________________________________________________                CLEAR LIQUID DIET   Foods Allowed                                                                     Foods Excluded  Coffee and tea, regular and decaf                             liquids that you cannot  Plain Jell-O in any flavor                                             see through such as: Fruit ices (not with fruit pulp)                                     milk, soups, orange juice  Iced Popsicles  All solid food Carbonated beverages, regular and diet                                    Cranberry, grape and apple juices Sports drinks like Gatorade Lightly seasoned clear broth or consume(fat free) Sugar, honey syrup  Sample Menu Breakfast                                Lunch                                     Supper Cranberry juice                    Beef broth                            Chicken broth Jell-O                                     Grape juice                           Apple juice Coffee or tea                        Jell-O                                      Popsicle                                                Coffee or tea                        Coffee or tea  _____________________________________________________________________  [facility] - Preparing for Surgery Before surgery, you can play an important role.  Because skin is not sterile, your skin needs to be as free of germs as possible.  You can reduce the number of germs on your skin by washing with CHG (chlorahexidine gluconate) soap before surgery.  CHG is an antiseptic cleaner which kills germs and bonds with the skin to continue killing germs even after washing. Please DO NOT use if you have an allergy to CHG or antibacterial soaps.  If your skin becomes reddened/irritated stop using the CHG and inform your nurse when you arrive at Short Stay. Do not shave (including legs and underarms) for at least 48 hours prior to the first CHG shower.  You may shave your face/neck. Please follow these instructions carefully:  1.  Shower with CHG Soap the night before surgery and the  morning of Surgery.  2.  If you choose to wash your hair, wash your hair first as usual with your  normal  shampoo.  3.  After you shampoo, rinse your hair and body thoroughly to remove the  shampoo.  4.  Use CHG as you would any other liquid soap.  You can apply chg directly  to the skin and wash                        Gently with a scrungie or clean washcloth.  5.  Apply the CHG Soap to your body ONLY FROM THE NECK DOWN.   Do not use on face/ open                           Wound or open sores. Avoid contact with eyes, ears mouth and genitals (private parts).                       Wash face,  Genitals (private parts) with your normal soap.             6.  Wash thoroughly, paying special attention to the area where your surgery  will be performed.  7.  Thoroughly rinse your body with warm water from the neck down.  8.  DO NOT shower/wash with your normal soap after using and rinsing off  the CHG Soap.                9.  Pat yourself dry with a clean towel.            10.  Wear clean pajamas.            11.  Place clean sheets on your bed the night of your first shower and do not  sleep with pets. Day of Surgery : Do not apply any lotions/deodorants the morning of surgery.  Please wear clean clothes to the [facility]/surgery center.  FAILURE TO FOLLOW THESE INSTRUCTIONS MAY RESULT IN THE CANCELLATION OF YOUR SURGERY PATIENT SIGNATURE_________________________________  NURSE SIGNATURE__________________________________  ________________________________________________________________________

## 2017-05-14 NOTE — Progress Notes (Signed)
CBC WITRH DIF, CMET, URINE PREG, UA 04-21-17 EPIC

## 2017-05-15 ENCOUNTER — Other Ambulatory Visit (HOSPITAL_COMMUNITY): Payer: Self-pay | Admitting: *Deleted

## 2017-05-15 NOTE — Progress Notes (Signed)
Patient called and said menustaral cycle started last night, urine pregnancy ordered for day of surgery 05-16-17.

## 2017-05-16 ENCOUNTER — Encounter (HOSPITAL_COMMUNITY)
Admission: RE | Disposition: A | Discharge status: Home / Self Care | Payer: Self-pay | Source: Ambulatory Visit | Attending: Surgery

## 2017-05-16 ENCOUNTER — Ambulatory Visit (HOSPITAL_COMMUNITY): Payer: BLUE CROSS/BLUE SHIELD | Admitting: Anesthesiology

## 2017-05-16 ENCOUNTER — Encounter (HOSPITAL_COMMUNITY): Payer: Self-pay | Admitting: *Deleted

## 2017-05-16 ENCOUNTER — Ambulatory Visit (HOSPITAL_COMMUNITY)
Admission: RE | Admit: 2017-05-16 | Discharge: 2017-05-16 | Disposition: A | Discharge status: Home / Self Care | Payer: BLUE CROSS/BLUE SHIELD | Source: Ambulatory Visit | Attending: Surgery | Admitting: Surgery

## 2017-05-16 DIAGNOSIS — K436 Other and unspecified ventral hernia with obstruction, without gangrene: Secondary | ICD-10-CM | POA: Diagnosis not present

## 2017-05-16 DIAGNOSIS — F17200 Nicotine dependence, unspecified, uncomplicated: Secondary | ICD-10-CM | POA: Insufficient documentation

## 2017-05-16 DIAGNOSIS — N803 Endometriosis of pelvic peritoneum: Secondary | ICD-10-CM | POA: Insufficient documentation

## 2017-05-16 DIAGNOSIS — Z79899 Other long term (current) drug therapy: Secondary | ICD-10-CM | POA: Insufficient documentation

## 2017-05-16 HISTORY — PX: INSERTION OF MESH: SHX5868

## 2017-05-16 HISTORY — PX: VENTRAL HERNIA REPAIR: SHX424

## 2017-05-16 LAB — PREGNANCY, URINE: Preg Test, Ur: NEGATIVE

## 2017-05-16 SURGERY — REPAIR, HERNIA, VENTRAL
Anesthesia: General

## 2017-05-16 MED ORDER — ROCURONIUM BROMIDE 50 MG/5ML IV SOSY
PREFILLED_SYRINGE | INTRAVENOUS | Status: AC
  Filled 2017-05-16: qty 5

## 2017-05-16 MED ORDER — CHLORHEXIDINE GLUCONATE CLOTH 2 % EX PADS
6.0000 | MEDICATED_PAD | Freq: Once | CUTANEOUS | Status: AC
  Administered 2017-05-16: 6 via TOPICAL

## 2017-05-16 MED ORDER — CEFAZOLIN SODIUM-DEXTROSE 2-4 GM/100ML-% IV SOLN
2.0000 g | INTRAVENOUS | Status: AC
  Administered 2017-05-16: 2 g via INTRAVENOUS
  Filled 2017-05-16: qty 100

## 2017-05-16 MED ORDER — SUGAMMADEX SODIUM 200 MG/2ML IV SOLN
INTRAVENOUS | Status: AC
  Filled 2017-05-16: qty 2

## 2017-05-16 MED ORDER — MIDAZOLAM HCL 2 MG/2ML IJ SOLN
INTRAMUSCULAR | Status: DC | PRN
  Administered 2017-05-16: 2 mg via INTRAVENOUS

## 2017-05-16 MED ORDER — LIDOCAINE 2% (20 MG/ML) 5 ML SYRINGE
INTRAMUSCULAR | Status: AC
  Filled 2017-05-16: qty 5

## 2017-05-16 MED ORDER — PROPOFOL 10 MG/ML IV BOLUS
INTRAVENOUS | Status: DC | PRN
  Administered 2017-05-16: 100 mg via INTRAVENOUS
  Administered 2017-05-16: 30 mg via INTRAVENOUS

## 2017-05-16 MED ORDER — HYDROMORPHONE HCL 1 MG/ML IJ SOLN
INTRAMUSCULAR | Status: AC
  Filled 2017-05-16: qty 1

## 2017-05-16 MED ORDER — FENTANYL CITRATE (PF) 250 MCG/5ML IJ SOLN
INTRAMUSCULAR | Status: AC
  Filled 2017-05-16: qty 5

## 2017-05-16 MED ORDER — DEXAMETHASONE SODIUM PHOSPHATE 10 MG/ML IJ SOLN
INTRAMUSCULAR | Status: AC
  Filled 2017-05-16: qty 1

## 2017-05-16 MED ORDER — LACTATED RINGERS IV SOLN
INTRAVENOUS | Status: DC
  Administered 2017-05-16: 13:00:00 via INTRAVENOUS

## 2017-05-16 MED ORDER — OXYCODONE HCL 5 MG PO TABS: 5.0000 mg | ORAL_TABLET | ORAL | 0 refills | Status: DC | PRN

## 2017-05-16 MED ORDER — LIDOCAINE 2% (20 MG/ML) 5 ML SYRINGE
INTRAMUSCULAR | Status: DC | PRN
  Administered 2017-05-16: 100 mg via INTRAVENOUS

## 2017-05-16 MED ORDER — EPHEDRINE 5 MG/ML INJ
INTRAVENOUS | Status: AC
  Filled 2017-05-16: qty 10

## 2017-05-16 MED ORDER — SODIUM CHLORIDE 0.9 % IJ SOLN
INTRAMUSCULAR | Status: AC
  Filled 2017-05-16: qty 10

## 2017-05-16 MED ORDER — GLYCOPYRROLATE 0.2 MG/ML IV SOSY
PREFILLED_SYRINGE | INTRAVENOUS | Status: AC
  Filled 2017-05-16: qty 5

## 2017-05-16 MED ORDER — PROPOFOL 10 MG/ML IV BOLUS
INTRAVENOUS | Status: AC
  Filled 2017-05-16: qty 20

## 2017-05-16 MED ORDER — SUFENTANIL CITRATE 50 MCG/ML IV SOLN
INTRAVENOUS | Status: DC | PRN
  Administered 2017-05-16 (×4): 5 ug via INTRAVENOUS

## 2017-05-16 MED ORDER — SUFENTANIL CITRATE 50 MCG/ML IV SOLN
INTRAVENOUS | Status: AC
  Filled 2017-05-16: qty 1

## 2017-05-16 MED ORDER — ROCURONIUM BROMIDE 50 MG/5ML IV SOSY
PREFILLED_SYRINGE | INTRAVENOUS | Status: DC | PRN
  Administered 2017-05-16: 50 mg via INTRAVENOUS

## 2017-05-16 MED ORDER — OXYCODONE HCL 5 MG PO TABS
5.0000 mg | ORAL_TABLET | Freq: Once | ORAL | Status: DC | PRN
  Filled 2017-05-16: qty 1

## 2017-05-16 MED ORDER — ONDANSETRON HCL 4 MG/2ML IJ SOLN
INTRAMUSCULAR | Status: DC | PRN
  Administered 2017-05-16: 4 mg via INTRAVENOUS

## 2017-05-16 MED ORDER — ONDANSETRON HCL 4 MG/2ML IJ SOLN
INTRAMUSCULAR | Status: AC
  Filled 2017-05-16: qty 2

## 2017-05-16 MED ORDER — MIDAZOLAM HCL 2 MG/2ML IJ SOLN
INTRAMUSCULAR | Status: AC
  Filled 2017-05-16: qty 2

## 2017-05-16 MED ORDER — HYDROMORPHONE HCL 1 MG/ML IJ SOLN
0.2500 mg | INTRAMUSCULAR | Status: DC | PRN
  Administered 2017-05-16 (×4): 0.5 mg via INTRAVENOUS

## 2017-05-16 MED ORDER — OXYCODONE HCL 5 MG PO TABS
5.0000 mg | ORAL_TABLET | ORAL | Status: DC | PRN
  Administered 2017-05-16: 5 mg via ORAL

## 2017-05-16 MED ORDER — OXYCODONE HCL 5 MG/5ML PO SOLN: 5.0000 mg | Freq: Once | ORAL | Status: DC | PRN

## 2017-05-16 MED ORDER — DEXAMETHASONE SODIUM PHOSPHATE 10 MG/ML IJ SOLN
INTRAMUSCULAR | Status: DC | PRN
  Administered 2017-05-16: 10 mg via INTRAVENOUS

## 2017-05-16 MED ORDER — BUPIVACAINE HCL (PF) 0.25 % IJ SOLN
INTRAMUSCULAR | Status: DC | PRN
  Administered 2017-05-16: 10 mL

## 2017-05-16 MED ORDER — BUPIVACAINE HCL (PF) 0.25 % IJ SOLN
INTRAMUSCULAR | Status: AC
  Filled 2017-05-16: qty 30

## 2017-05-16 MED ORDER — 0.9 % SODIUM CHLORIDE (POUR BTL) OPTIME
TOPICAL | Status: DC | PRN
  Administered 2017-05-16: 1000 mL

## 2017-05-16 SURGICAL SUPPLY — 25 items
BINDER ABDOMINAL 12 ML 46-62 (SOFTGOODS) IMPLANT
CHLORAPREP W/TINT 26ML (MISCELLANEOUS) ×6 IMPLANT
CLOSURE WOUND 1/2 X4 (GAUZE/BANDAGES/DRESSINGS) ×1
COVER SURGICAL LIGHT HANDLE (MISCELLANEOUS) ×3 IMPLANT
DRAPE LAPAROSCOPIC ABDOMINAL (DRAPES) ×3 IMPLANT
ELECT REM PT RETURN 15FT ADLT (MISCELLANEOUS) ×3 IMPLANT
GAUZE SPONGE 4X4 12PLY STRL (GAUZE/BANDAGES/DRESSINGS) ×3 IMPLANT
GLOVE SURG ORTHO 8.0 STRL STRW (GLOVE) ×3 IMPLANT
GOWN STRL REUS W/TWL XL LVL3 (GOWN DISPOSABLE) ×6 IMPLANT
KIT BASIN OR (CUSTOM PROCEDURE TRAY) ×3 IMPLANT
MARKER SKIN DUAL TIP RULER LAB (MISCELLANEOUS) ×3 IMPLANT
MESH VENTRALEX ST 8CM LRG (Mesh General) ×3 IMPLANT
PACK GENERAL/GYN (CUSTOM PROCEDURE TRAY) ×3 IMPLANT
STAPLER VISISTAT 35W (STAPLE) ×3 IMPLANT
STRIP CLOSURE SKIN 1/2X4 (GAUZE/BANDAGES/DRESSINGS) ×2 IMPLANT
SUT MNCRL AB 4-0 PS2 18 (SUTURE) ×3 IMPLANT
SUT NOVA 1 T20/GS 25DT (SUTURE) IMPLANT
SUT NOVA NAB GS-21 0 18 T12 DT (SUTURE) ×6 IMPLANT
SUT PDS AB 1 CTX 36 (SUTURE) IMPLANT
SUT SILK 3 0 (SUTURE) ×2
SUT SILK 3-0 18XBRD TIE 12 (SUTURE) ×1 IMPLANT
SUT VIC AB 2-0 CT2 27 (SUTURE) IMPLANT
SUT VIC AB 2-0 SH 18 (SUTURE) ×3 IMPLANT
TOWEL OR 17X26 10 PK STRL BLUE (TOWEL DISPOSABLE) ×3 IMPLANT
TRAY FOLEY W/METER SILVER 16FR (SET/KITS/TRAYS/PACK) IMPLANT

## 2017-05-16 NOTE — Transfer of Care (Signed)
Immediate Anesthesia Transfer of Care Note  Patient: Tina Bauer  Procedure(s) Performed: OPEN REPAIR INCARCERATED VENTRAL HERNIA WITH MESH PATCH (N/A ) INSERTION OF MESH (N/A )  Patient Location: PACU  Anesthesia Type:General  Level of Consciousness: awake and patient cooperative  Airway & Oxygen Therapy: Patient Spontanous Breathing and Patient connected to face mask oxygen  Post-op Assessment: Report given to RN and Post -op Vital signs reviewed and stable  Post vital signs: Reviewed and stable  Last Vitals:  Vitals:   05/16/17 1225 05/16/17 1532  BP: 113/76 126/86  Pulse: 93   Resp: 18 15  Temp: 36.7 C   SpO2: 100%     Last Pain:  Vitals:   05/16/17 1300  TempSrc:   PainSc: 7       Patients Stated Pain Goal: 5 (05/16/17 1300)  Complications: No apparent anesthesia complications

## 2017-05-16 NOTE — Anesthesia Procedure Notes (Signed)
Procedure Name: Intubation Date/Time: 05/16/2017 2:10 PM Performed by: Dione Booze, CRNA Pre-anesthesia Checklist: Patient identified, Emergency Drugs available, Suction available and Patient being monitored Patient Re-evaluated:Patient Re-evaluated prior to induction Oxygen Delivery Method: Circle system utilized Preoxygenation: Pre-oxygenation with 100% oxygen Induction Type: IV induction Ventilation: Mask ventilation without difficulty Laryngoscope Size: Mac and 4 Grade View: Grade I Tube type: Oral Tube size: 7.5 mm Number of attempts: 1 Airway Equipment and Method: Stylet Placement Confirmation: ETT inserted through vocal cords under direct vision,  positive ETCO2 and breath sounds checked- equal and bilateral Secured at: 22 cm Tube secured with: Tape Dental Injury: Teeth and Oropharynx as per pre-operative assessment

## 2017-05-16 NOTE — Brief Op Note (Signed)
05/16/2017  3:31 PM  PATIENT:  Tina Bauer  36 y.o. female  PRE-OPERATIVE DIAGNOSIS:  incarcerated ventral hernia  POST-OPERATIVE DIAGNOSIS:  incarcerated ventral hernia  PROCEDURE:  1.  Procedure(s): OPEN REPAIR INCARCERATED VENTRAL HERNIA WITH MESH PATCH (N/A) INSERTION OF MESH (N/A)  2. Excision of implant from abdominal wall (2 cm)  SURGEON:  Surgeon(s) and Role:    Tina Level* [name], MD - Primary  ANESTHESIA:   general  EBL:  25 mL   BLOOD ADMINISTERED:none  DRAINS: none   LOCAL MEDICATIONS USED:  MARCAINE     SPECIMEN:  Excision  DISPOSITION OF SPECIMEN:  PATHOLOGY  COUNTS:  YES  TOURNIQUET:  * No tourniquets in log *  DICTATION: .Other Dictation: Dictation Number [id number]  PLAN OF CARE: Discharge to home after PACU  PATIENT DISPOSITION:  PACU - hemodynamically stable.   Delay start of Pharmacological VTE agent (>24hrs) due to surgical blood loss or risk of bleeding: yes  Tina Levelodd Adele Milson, MD [facility] Office: [phone]

## 2017-05-16 NOTE — Discharge Instructions (Signed)
General Anesthesia, Adult, Care After °These instructions provide you with information about caring for yourself after your procedure. Your health care provider may also give you more specific instructions. Your treatment has been planned according to current medical practices, but problems sometimes occur. Call your health care provider if you have any problems or questions after your procedure. °What can I expect after the procedure? °After the procedure, it is common to have: °· Vomiting. °· A sore throat. °· Mental slowness. ° °It is common to feel: °· Nauseous. °· Cold or shivery. °· Sleepy. °· Tired. °· Sore or achy, even in parts of your body where you did not have surgery. ° °Follow these instructions at home: °For at least 24 hours after the procedure: °· Do not: °? Participate in activities where you could fall or become injured. °? Drive. °? Use heavy machinery. °? Drink alcohol. °? Take sleeping pills or medicines that cause drowsiness. °? Make important decisions or sign legal documents. °? Take care of children on your own. °· Rest. °Eating and drinking °· If you vomit, drink water, juice, or soup when you can drink without vomiting. °· Drink enough fluid to keep your urine clear or pale yellow. °· Make sure you have little or no nausea before eating solid foods. °· Follow the diet recommended by your health care provider. °General instructions °· Have a responsible adult stay with you until you are awake and alert. °· Return to your normal activities as told by your health care provider. Ask your health care provider what activities are safe for you. °· Take over-the-counter and prescription medicines only as told by your health care provider. °· If you smoke, do not smoke without supervision. °· Keep all follow-up visits as told by your health care provider. This is important. °Contact a health care provider if: °· You continue to have nausea or vomiting at home, and medicines are not helpful. °· You  cannot drink fluids or start eating again. °· You cannot urinate after 8-12 hours. °· You develop a skin rash. °· You have fever. °· You have increasing redness at the site of your procedure. °Get help right away if: °· You have difficulty breathing. °· You have chest pain. °· You have unexpected bleeding. °· You feel that you are having a life-threatening or urgent problem. °This information is not intended to replace advice given to you by your health care provider. Make sure you discuss any questions you have with your health care provider. °Document Released: 09/03/2000 Document Revised: 10/31/2015 Document Reviewed: 05/12/2015 °Elsevier Interactive Patient Education © 2018 [employer] ° °

## 2017-05-16 NOTE — Anesthesia Postprocedure Evaluation (Signed)
Anesthesia Post Note  Patient: ArboriculturistCrystal Flury  Procedure(s) Performed: OPEN REPAIR INCARCERATED VENTRAL HERNIA WITH MESH PATCH (N/A ) INSERTION OF MESH (N/A )     Patient location during evaluation: PACU Anesthesia Type: General Level of consciousness: sedated Pain management: pain level controlled Vital Signs Assessment: post-procedure vital signs reviewed and stable Respiratory status: spontaneous breathing and respiratory function stable Cardiovascular status: stable Postop Assessment: no apparent nausea or vomiting Anesthetic complications: no    Last Vitals:  Vitals:   05/16/17 1645 05/16/17 1700  BP: 104/69 102/74  Pulse: 79 76  Resp: 13   Temp:  36.7 C  SpO2: 92% 96%    Last Pain:  Vitals:   05/16/17 1645  TempSrc:   PainSc: 6                  Dyamon Sosinski DANIEL

## 2017-05-16 NOTE — Anesthesia Preprocedure Evaluation (Addendum)
Anesthesia Evaluation  Patient identified by MRN, date of birth, ID band Patient awake    Reviewed: Allergy & Precautions, NPO status , Patient's Chart, lab work & pertinent test results  History of Anesthesia Complications Negative for: history of anesthetic complications  Airway Mallampati: I  TM Distance: >3 FB Neck ROM: Full    Dental no notable dental hx.    Pulmonary shortness of breath, Current Smoker,    breath sounds clear to auscultation       Cardiovascular negative cardio ROS   Rhythm:Regular Rate:Normal     Neuro/Psych negative neurological ROS     GI/Hepatic negative GI ROS, Neg liver ROS,   Endo/Other  negative endocrine ROS  Renal/GU negative Renal ROS     Musculoskeletal negative musculoskeletal ROS (+)   Abdominal   Peds  Hematology negative hematology ROS (+)   Anesthesia Other Findings   Reproductive/Obstetrics                            Anesthesia Physical Anesthesia Plan  ASA: II  Anesthesia Plan: General   Post-op Pain Management:    Induction: Intravenous  PONV Risk Score and Plan: 4 or greater and Dexamethasone, Ondansetron, Treatment may vary due to age or medical condition and Scopolamine patch - Pre-op  Airway Management Planned: Oral ETT  Additional Equipment:   Intra-op Plan:   Post-operative Plan: Extubation in OR  Informed Consent: I have reviewed the patients History and Physical, chart, labs and discussed the procedure including the risks, benefits and alternatives for the proposed anesthesia with the patient or authorized representative who has indicated his/her understanding and acceptance.   Dental advisory given  Plan Discussed with: CRNA  Anesthesia Plan Comments:         Anesthesia Quick Evaluation

## 2017-05-16 NOTE — Interval H&P Note (Signed)
History and Physical Interval Note:  05/16/2017 1:25 PM  Tina Bauer  has presented today for surgery, with the diagnosis of incarcerated ventral hernia  The various methods of treatment have been discussed with the patient and family. After consideration of risks, benefits and other options for treatment, the patient has consented to    Procedure(s): OPEN REPAIR INCARCERATED VENTRAL HERNIA WITH MESH (N/A) INSERTION OF MESH (N/A) as a surgical intervention .    The patient's history has been reviewed, patient examined, no change in status, stable for surgery.  I have reviewed the patient's chart and labs.  Questions were answered to the patient's satisfaction.    Darnell Levelodd Adon Gehlhausen, MD [facility] Office: [phone]    Decari Duggar MJudie Petit

## 2017-05-17 ENCOUNTER — Encounter (HOSPITAL_COMMUNITY): Payer: Self-pay | Admitting: Surgery

## 2017-05-17 NOTE — Op Note (Signed)
NAMGeorga Bauer:  Bauer, Tina            ACCOUNT NO.:  [id number]  MEDICAL RECORD NO.:  [id number]  LOCATION:                                 FACILITY:  PHYSICIAN:  [name], MD      DATE OF BIRTH:  1981/01/23  DATE OF PROCEDURE:  05/16/2017                              OPERATIVE REPORT   PREOPERATIVE DIAGNOSIS:  Incarcerated ventral hernia.  POSTOPERATIVE DIAGNOSIS:  Incarcerated ventral hernia, implant abdominal wall (2 cm).  PROCEDURES: 1. Repair of incarcerated ventral hernia with mesh patch. 2. Excision of implant abdominal wall (2 cm).  SURGEON:  Velora Hecklerodd M [name], MD  ANESTHESIA:  General.  ESTIMATED BLOOD LOSS:  25 mL.  PREPARATION:  ChloraPrep.  COMPLICATIONS:  None.  INDICATIONS:  The patient is a 36 year old female, [facility] with [facility] hernia.  The patient had presented with abdominal pain.  CT scan of the abdomen and pelvis demonstrated an area in the upper abdomen consistent with incarcerated hernia containing adipose tissue, but not intestine. She now comes to Surgery for repair.  BODY OF REPORT:  Procedure [facility].  The patient was brought to the operating room, placed in supine position on the operating room table.  Following administration of general anesthesia, the patient is positioned and then prepped and draped in the usual aseptic fashion.  After ascertaining that an adequate level of anesthesia had been achieved, a midline incision is made just above the level of the umbilicus.  Dissection is carried into subcutaneous tissues.  Hernia sac is identified, containing adipose tissue.  This is gently dissected out down to the fascia.  There appears to be at least 2 or maybe 3 fascial defects, through which, I adipose tissue emanates.  Bridges of fascia are incised allowing the defect to be converted to a single defect, measuring approximately  3 cm in diameter.  It contains incarcerated adipose tissue, which is freed up circumferentially.  Using hemostats, the adipose tissue is divided between hemostats with the electrocautery and ligated with 3-0 silk ties, and the incarcerated tissue is excised and discarded, representing what appears to be benign adipose tissue.  Palpation of the abdominal wall internally through the fascial defect demonstrates no other signs of hernia.  There is an area of indurated hard tissue at the base of the umbilicus.  This does not appear to connect to the peritoneal surface and there is no evidence of umbilical hernia.  However, upon mobilizing the subcutaneous tissues around the hernia defect, a firm mass is noted to the right of midline and just above the level of the umbilicus.  This appears to be neoplastic.  It is excised in continuity with the hernia defect extending the hernia defect slightly, but allowing for full thickness excision of the mass.  This was marked with a suture and submitted to Pathology where frozen section shows an inflammatory reaction with possible neoplasm either carcinoma versus endometriosis.  This was read by Dr. [name] and discussed intraoperatively.  Further studies will be performed immediately.  The fascial defect now measures approximately 4-4.5 cm in maximum diameter.  A  large Ventralex ST mesh patch measuring 8 cm in diameter is selected for repair.  This is prepared and placed into the peritoneal cavity and pulled up against the posterior aspect of the abdominal wall. Using interrupted 0 Novafil sutures, the overlying defect is closed incorporating some of the mesh patch into the closure allowing for reinforcement of the repair.  The entire fascial defect is closed primarily over the underlying mesh in this fashion.  Good hemostasis is noted.  Subcutaneous tissues are closed with interrupted 2-0 Vicryl sutures.  Skin is anesthetized with  local anesthetic.  Skin is closed with a running 4-0 Monocryl subcuticular suture.  Wound is washed and dried, and Steri-Strips and a dry gauze dressing are applied.  The patient is awakened from anesthesia and taken to the recovery room.  The patient tolerated the procedure well.    [name], MD [facility] Office: [phone]      TMG/MEDQ  D:  05/16/2017  T:  05/16/2017  Job:  [id number]  cc:   Raeford RazorStephen Kohut, MD

## 2017-05-21 NOTE — Progress Notes (Signed)
Tina Bauer  - notify patient that path results on nodule taken out at time of surgery was BENIGN.  Thanks,  Darnell Levelodd Sylena Lotter, MD [facility] Office: [phone]

## 2018-07-08 ENCOUNTER — Emergency Department (HOSPITAL_BASED_OUTPATIENT_CLINIC_OR_DEPARTMENT_OTHER)
Admission: EM | Admit: 2018-07-08 | Discharge: 2018-07-08 | Disposition: A | Discharge status: Home / Self Care | Payer: BLUE CROSS/BLUE SHIELD | Attending: Emergency Medicine | Admitting: Emergency Medicine

## 2018-07-08 ENCOUNTER — Encounter (HOSPITAL_BASED_OUTPATIENT_CLINIC_OR_DEPARTMENT_OTHER): Payer: Self-pay | Admitting: Emergency Medicine

## 2018-07-08 ENCOUNTER — Other Ambulatory Visit: Payer: Self-pay

## 2018-07-08 DIAGNOSIS — F17210 Nicotine dependence, cigarettes, uncomplicated: Secondary | ICD-10-CM | POA: Insufficient documentation

## 2018-07-08 DIAGNOSIS — L0291 Cutaneous abscess, unspecified: Secondary | ICD-10-CM | POA: Diagnosis present

## 2018-07-08 DIAGNOSIS — N764 Abscess of vulva: Secondary | ICD-10-CM | POA: Insufficient documentation

## 2018-07-08 MED ORDER — IBUPROFEN 800 MG PO TABS: 800.0000 mg | ORAL_TABLET | Freq: Three times a day (TID) | ORAL | 0 refills | Status: DC | PRN

## 2018-07-08 MED ORDER — LIDOCAINE-EPINEPHRINE 2 %-1:100000 IJ SOLN
20.0000 mL | Freq: Once | INTRAMUSCULAR | Status: DC
  Filled 2018-07-08: qty 20

## 2018-07-08 MED ORDER — LIDOCAINE HCL 2 % IJ SOLN
INTRAMUSCULAR | Status: AC
  Filled 2018-07-08: qty 20

## 2018-07-08 MED ORDER — HYDROCODONE-ACETAMINOPHEN 5-325 MG PO TABS: 1.0000 | ORAL_TABLET | Freq: Four times a day (QID) | ORAL | 0 refills | Status: DC | PRN

## 2018-07-08 MED ORDER — LIDOCAINE-EPINEPHRINE (PF) 2 %-1:200000 IJ SOLN
INTRAMUSCULAR | Status: AC
  Administered 2018-07-08: 10 mL
  Filled 2018-07-08: qty 10

## 2018-07-08 MED ORDER — AMOXICILLIN-POT CLAVULANATE 875-125 MG PO TABS: 1.0000 | ORAL_TABLET | Freq: Two times a day (BID) | ORAL | 0 refills | Status: DC

## 2018-07-08 NOTE — ED Notes (Signed)
ED Provider at bedside. 

## 2018-07-08 NOTE — ED Notes (Addendum)
Pt ambulatory to room. Pt states she has boil on inner labia, right side x 3 days. States no drainage at this time, rating pain 8/10. Denies fever, denies taking pain medication today.

## 2018-07-08 NOTE — ED Provider Notes (Signed)
[facility] EMERGENCY DEPARTMENT Provider Note   CSN: [id number] Arrival date & time: 07/08/18  [id number]     History   Chief Complaint Chief Complaint  Patient presents with  . Abscess    HPI Tina Bauer is a 38 y.o. female.  The history is provided by the patient. No language interpreter was used.  Abscess   Tina Bauer is a 38 y.o. female who presents to the Emergency Department complaining of abscess. The emergency department for evaluation of swelling and pain to her right labia. Symptoms began three days ago with a small bump. The swelling has significantly worsened since yesterday. She denies any fevers, nausea, vomiting, dysuria. Denies any chance of pregnancy. She has a history of abscess to the same area in the past. She has no medical problems and takes no medications. Past Medical History:  Diagnosis Date  . Dyspnea    occasional due to contusion of abdoman  . Headache   . Hernia, umbilical     Patient Active Problem List   Diagnosis Date Noted  . Incarcerated ventral hernia 05/13/2017    Past Surgical History:  Procedure Laterality Date  . INSERTION OF MESH N/A 05/16/2017   Procedure: INSERTION OF MESH;  Surgeon: Darnell LevelGerkin, Todd, MD;  Location: [facility] ORS;  Service: General;  Laterality: N/A;  . OPEN REDUCTION INTERNAL FIXATION (ORIF) DISTAL RADIAL FRACTURE Left 07/12/2016   Procedure: ORIF left distal radius fracture with repair reconstruction as necessary, possible ORIF left ulna fracture;  Surgeon: Dominica SeverinWilliam Gramig, MD;  [facility];  Service: Orthopedics;  Laterality: Left;  90 mins  . VENTRAL HERNIA REPAIR N/A 05/16/2017   Procedure: OPEN REPAIR INCARCERATED VENTRAL HERNIA WITH MESH PATCH;  Surgeon: Darnell LevelGerkin, Todd, MD;  Location: [facility] ORS;  Service: General;  Laterality: N/A;     OB History   No obstetric history on file.      Home Medications    Prior to Admission medications   Medication Sig Start Date End Date Taking? Authorizing Provider    oxyCODONE (OXY IR/ROXICODONE) 5 MG immediate release tablet Take 1-2 tablets (5-10 mg total) by mouth every 4 (four) hours as needed for moderate pain. 05/16/17   Darnell LevelGerkin, Todd, MD    Family History History reviewed. No pertinent family history.  Social History Social History   Tobacco Use  . Smoking status: Current Every Day Smoker    Packs/day: 0.25    Years: 15.00    Pack years: 3.75    Types: Cigarettes  . Smokeless tobacco: Never Used  . Tobacco comment: has cut back only about 3 cig a day  Substance Use Topics  . Alcohol use: Yes    Comment: ocassionally  . Drug use: No     Allergies   Fentanyl   Review of Systems Review of Systems  All other systems reviewed and are negative.    Physical Exam Updated Vital Signs BP (!) 125/92 (BP Location: Right Arm)   Pulse 78   Temp 98 F (36.7 C) (Oral)   Resp 18   Ht 5\' 8"  (1.727 m)   Wt 89.8 kg   LMP 07/05/2018 (Exact Date)   SpO2 99%   BMI 30.11 kg/m   Physical Exam Vitals signs and nursing note reviewed.  Constitutional:      Appearance: Normal appearance.  HENT:     Head: Normocephalic and atraumatic.  Cardiovascular:     Rate and Rhythm: Normal rate and regular rhythm.  Pulmonary:     Effort: Pulmonary  effort is normal. No respiratory distress.  Abdominal:     Palpations: Abdomen is soft.     Tenderness: There is no abdominal tenderness.  Genitourinary:      Comments: Right outer lateral labia with moderate erythema and induration.  There is a focal area of fluctuance, approximately 2 cm in diameter with moderate surrounding edema.   Musculoskeletal:        General: No swelling.  Skin:    General: Skin is warm and dry.     Capillary Refill: Capillary refill takes less than 2 seconds.  Neurological:     Mental Status: She is alert and oriented to person, place, and time.  Psychiatric:        Mood and Affect: Mood normal.        Behavior: Behavior normal.      ED Treatments / Results   Labs (all labs ordered are listed, but only abnormal results are displayed) Labs Reviewed - No data to display  EKG None  Radiology No results found.  Procedures .Marland KitchenIncision and Drainage Date/Time: 07/08/2018 10:51 AM Performed by: Tilden Fossa, MD Authorized by: Tilden Fossa, MD   Consent:    Consent obtained:  Verbal   Consent given by:  Patient   Risks discussed:  Bleeding, incomplete drainage, infection and pain   Alternatives discussed:  Alternative treatment Location:    Type:  Abscess   Location:  Anogenital   Anogenital location:  Vulva Pre-procedure details:    Skin preparation:  Chloraprep Anesthesia (see MAR for exact dosages):    Anesthesia method:  Local infiltration   Local anesthetic:  Lidocaine 2% WITH epi Procedure type:    Complexity:  Complex Procedure details:    Incision types:  Single straight   Scalpel blade:  11   Wound management:  Probed and deloculated and irrigated with saline   Drainage:  Purulent and bloody   Drainage amount:  Moderate   Wound treatment:  Wound left open   Packing materials:  None Post-procedure details:    Patient tolerance of procedure:  Tolerated well, no immediate complications   (including critical care time)  Medications Ordered in ED Medications  lidocaine-EPINEPHrine (XYLOCAINE W/EPI) 2 %-1:100000 (with pres) injection 20 mL (has no administration in time range)     Initial Impression / Assessment and Plan / ED Course  I have reviewed the triage vital signs and the nursing notes.  Pertinent labs & imaging results that were available during my care of the patient were reviewed by me and considered in my medical decision making (see chart for details).     Pt here for evaluation of labial swelling. She has an abscess on examination with surrounding cellulitis. I&D performed per procedure note. She is nontoxic appearing on exam with no evidence of necrotizing soft tissue infection.  Given degree of  surrounding cellulitis, will treat with antibiotics. Counseled patient on home wound care, outpatient follow-up and return precautions.  Final Clinical Impressions(s) / ED Diagnoses   Final diagnoses:  Labial abscess    ED Discharge Orders    None       Tilden Fossa, MD 07/08/18 1053

## 2018-07-08 NOTE — ED Triage Notes (Signed)
Pt states she has abscess on right side inner labia x 3 days that has increased in size. No drainage, painful to touch

## 2019-05-10 ENCOUNTER — Other Ambulatory Visit: Payer: Self-pay

## 2019-05-10 ENCOUNTER — Encounter (HOSPITAL_BASED_OUTPATIENT_CLINIC_OR_DEPARTMENT_OTHER): Payer: Self-pay | Admitting: Emergency Medicine

## 2019-05-10 ENCOUNTER — Emergency Department (HOSPITAL_BASED_OUTPATIENT_CLINIC_OR_DEPARTMENT_OTHER)
Admission: EM | Admit: 2019-05-10 | Discharge: 2019-05-10 | Disposition: A | Discharge status: Home / Self Care | Payer: BC Managed Care – PPO | Attending: Emergency Medicine | Admitting: Emergency Medicine

## 2019-05-10 DIAGNOSIS — Z5321 Procedure and treatment not carried out due to patient leaving prior to being seen by health care provider: Secondary | ICD-10-CM | POA: Diagnosis not present

## 2019-05-10 DIAGNOSIS — J029 Acute pharyngitis, unspecified: Secondary | ICD-10-CM | POA: Insufficient documentation

## 2019-05-10 NOTE — ED Triage Notes (Signed)
Pt reports sore throat ongoing since Wednesday. Pt seen at urgent care 2-3 days ago and tested for strep and mono, which were negative, and given prescription for amoxicillin. Pt reports unable to swallow and having white patches in throat and mouth.

## 2019-05-12 ENCOUNTER — Other Ambulatory Visit: Payer: Self-pay

## 2019-05-12 ENCOUNTER — Emergency Department (HOSPITAL_BASED_OUTPATIENT_CLINIC_OR_DEPARTMENT_OTHER)
Admission: EM | Admit: 2019-05-12 | Discharge: 2019-05-12 | Disposition: A | Discharge status: Home / Self Care | Payer: BC Managed Care – PPO | Attending: Emergency Medicine | Admitting: Emergency Medicine

## 2019-05-12 ENCOUNTER — Encounter (HOSPITAL_BASED_OUTPATIENT_CLINIC_OR_DEPARTMENT_OTHER): Payer: Self-pay | Admitting: Emergency Medicine

## 2019-05-12 DIAGNOSIS — F17210 Nicotine dependence, cigarettes, uncomplicated: Secondary | ICD-10-CM | POA: Insufficient documentation

## 2019-05-12 DIAGNOSIS — J36 Peritonsillar abscess: Secondary | ICD-10-CM | POA: Diagnosis not present

## 2019-05-12 DIAGNOSIS — J029 Acute pharyngitis, unspecified: Secondary | ICD-10-CM | POA: Diagnosis present

## 2019-05-12 MED ORDER — AMOXICILLIN-POT CLAVULANATE 875-125 MG PO TABS: 1.0000 | ORAL_TABLET | Freq: Two times a day (BID) | ORAL | 0 refills | Status: AC

## 2019-05-12 MED ORDER — PREDNISONE 20 MG PO TABS: 40.0000 mg | ORAL_TABLET | Freq: Every day | ORAL | 0 refills | Status: AC

## 2019-05-12 MED FILL — AMOX-CLAV 875-125 MG TABLET: 875-125 | 7 days supply | Qty: 14 | Fill #0

## 2019-05-12 MED FILL — predniSONE 20 MG TABS: 20 | 5 days supply | Qty: 10 | Fill #0

## 2019-05-12 NOTE — ED Triage Notes (Signed)
Sore throat 6 days. Seen at UC 3 days ago and placed on Amoxicillin.  Sts she felt a "pop" yesterday and pus drained out.  She presents today "just to be checked."

## 2019-05-12 NOTE — Discharge Instructions (Addendum)
You were evaluated in the Emergency Department and after careful evaluation, we did not find any emergent condition requiring admission or further testing in the hospital.  Your exam/testing today is overall reassuring.  Your symptoms seem to be due to an abscess next to the tonsil.  It is encouraging that the abscess is already draining.  We are going to switch your antibiotic to Augmentin, please take as directed.  We also recommend that you take the prednisone medication to help with the inflammation.  Please return to the Emergency Department if you experience any worsening of your condition.  We encourage you to follow up with a primary care provider.  Thank you for allowing [place] to be a part of your care.

## 2019-05-12 NOTE — ED Provider Notes (Signed)
[facility]-EMERGENCY DEPT [facility] Emergency Department Provider Note MRN:  [id number]  Arrival date & time: 05/12/19     Chief Complaint   Sore Throat   History of Present Illness   Tina Bauer is a 39 y.o. year-old female with no pertinent past medical history presenting to the ED with chief complaint of sore throat.  3 days of sore throat, right greater than left.  Seen at urgent care initially, provided with amoxicillin.  Yesterday felt a pop near her right tonsil and then the expression of purulent material from that area.  Here today to be reexamined.  Denies fever, no cough, no body aches, no other complaints.  Has been taking amoxicillin for 2 or 3 days.  Review of Systems  A complete 10 system review of systems was obtained and all systems are negative except as noted in the HPI and PMH.   Patient's Health History    Past Medical History:  Diagnosis Date  . Dyspnea    occasional due to contusion of abdoman  . Headache   . Hernia, umbilical     Past Surgical History:  Procedure Laterality Date  . INSERTION OF MESH N/A 05/16/2017   Procedure: INSERTION OF MESH;  Surgeon: Darnell Level, MD;  Location: [facility] ORS;  Service: General;  Laterality: N/A;  . OPEN REDUCTION INTERNAL FIXATION (ORIF) DISTAL RADIAL FRACTURE Left 07/12/2016   Procedure: ORIF left distal radius fracture with repair reconstruction as necessary, possible ORIF left ulna fracture;  Surgeon: Dominica Severin, MD;  Location: [facility] OR;  Service: Orthopedics;  Laterality: Left;  90 mins  . VENTRAL HERNIA REPAIR N/A 05/16/2017   Procedure: OPEN REPAIR INCARCERATED VENTRAL HERNIA WITH MESH PATCH;  Surgeon: Darnell Level, MD;  Location: [facility] ORS;  Service: General;  Laterality: N/A;    No family history on file.  Social History   Socioeconomic History  . Marital status: Single    Spouse name: Not on file  . Number of children: Not on file  . Years of education: Not on file  . Highest education level: Not on  file  Occupational History  . Not on file  Social Needs  . Financial resource strain: Not on file  . Food insecurity    Worry: Not on file    Inability: Not on file  . Transportation needs    Medical: Not on file    Non-medical: Not on file  Tobacco Use  . Smoking status: Current Every Day Smoker    Packs/day: 0.25    Years: 15.00    Pack years: 3.75    Types: Cigarettes  . Smokeless tobacco: Never Used  . Tobacco comment: has cut back only about 3 cig a day  Substance and Sexual Activity  . Alcohol use: Yes    Comment: ocassionally  . Drug use: No  . Sexual activity: Yes    Birth control/protection: None  Lifestyle  . Physical activity    Days per week: Not on file    Minutes per session: Not on file  . Stress: Not on file  Relationships  . Social Musician on phone: Not on file    Gets together: Not on file    Attends religious service: Not on file    Active member of club or organization: Not on file    Attends meetings of clubs or organizations: Not on file    Relationship status: Not on file  . Intimate partner violence    Fear of  current or ex partner: Not on file    Emotionally abused: Not on file    Physically abused: Not on file    Forced sexual activity: Not on file  Other Topics Concern  . Not on file  Social History Narrative   ** Merged History Encounter **         Physical Exam  Vital Signs and Nursing Notes reviewed Vitals:   05/12/19 0710  BP: 121/85  Pulse: 75  Resp: 14  Temp: 98.3 F (36.8 C)  SpO2: 100%    CONSTITUTIONAL: Well-appearing, NAD NEURO:  Alert and oriented x 3, no focal deficits EYES:  eyes equal and reactive ENT/NECK:  no LAD, no JVD; mild tonsillar edema and erythema bilaterally, increased edema surrounding the right tonsil with adjacent area of white discoloration CARDIO: Regular rate, well-perfused, normal S1 and S2 PULM:  CTAB no wheezing or rhonchi GI/GU:  normal bowel sounds, non-distended, non-tender  MSK/SPINE:  No gross deformities, no edema SKIN:  no rash, atraumatic PSYCH:  Appropriate speech and behavior  Diagnostic and Interventional Summary    Labs Reviewed - No data to display  No orders to display    Medications - No data to display   Procedures  /  Critical Care Procedures  ED Course and Medical Decision Making  I have reviewed the triage vital signs and the nursing notes.  Pertinent labs & imaging results that were available during my care of the patient were reviewed by me and considered in my medical decision making (see below for details).     History and exam is consistent with peritonsillar abscess that is already drained.  Patient is without signs of systemic illness, normal vital signs.  She is having some trouble fully opening her mouth due to the discomfort, but she explains that this has been improving over the past few days.  Currently a incision and drainage attempt is not indicated as it is already drained based on history.  A change to antibiotics is warranted, providing Augmentin instead of amoxicillin.  Also provide prednisone to aid with edema and inflammation.  Discussed with patient the possibility of reaccumulation need for I&D.  Patient will return to emergency department with worsening of condition.    Barth Kirks. Sedonia Small, MD [place] [email or web address]  Final Clinical Impressions(s) / ED Diagnoses     ICD-10-CM   1. Peritonsillar abscess  J36   2. Sore throat  J02.9     ED Discharge Orders         Ordered    amoxicillin-clavulanate (AUGMENTIN) 875-125 MG tablet  Every 12 hours     05/12/19 0726    predniSONE (DELTASONE) 20 MG tablet  Daily     05/12/19 0726           Discharge Instructions Discussed with and Provided to Patient:     Discharge Instructions     You were evaluated in the Emergency Department and after careful evaluation, we did not find any emergent condition  requiring admission or further testing in the hospital.  Your exam/testing today is overall reassuring.  Your symptoms seem to be due to an abscess next to the tonsil.  It is encouraging that the abscess is already draining.  We are going to switch your antibiotic to Augmentin, please take as directed.  We also recommend that you take the prednisone medication to help with the inflammation.  Please return to the Emergency Department if you experience any  worsening of your condition.  We encourage you to follow up with a primary care provider.  Thank you for allowing us to be a part of your care.      Sabas SousBero,  M, MD 05/12/19 0730

## 2019-07-02 IMAGING — CT CT ABD-PELV W/ CM
2 of 5 series · 15 of 46 positions shown, 17 images · IV contrast (APPLIED)
Comparison: CT dated 08/15/2016

CLINICAL DATA: 36-year-old female with abdominal pain. History of
mid abdominal hernia with worsening of the symptoms over the hernia.

EXAM:
CT ABDOMEN AND PELVIS WITH CONTRAST
TECHNIQUE: Multidetector CT imaging of the abdomen and pelvis was performed
using the standard protocol following bolus administration of
intravenous contrast.
CONTRAST:  100mL MO4OD8-XZZ IOPAMIDOL (MO4OD8-XZZ) INJECTION 61%

[Series 2: axial st · axial · 0.89mm/px · z∈[+527,+952]mm · 12 of 96 slices shown, 14 images]
[im 6/96  soft-tissue]
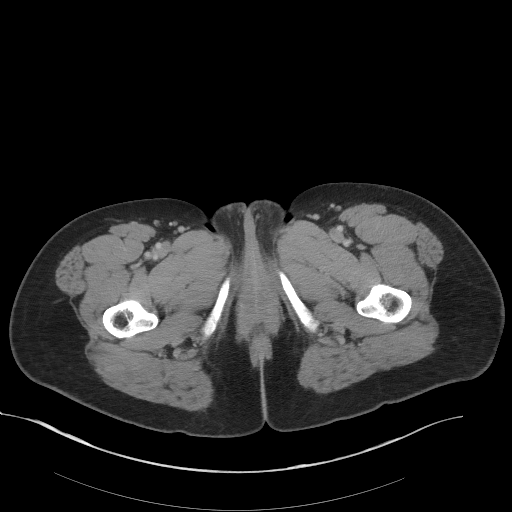
[im 6/96  bone]
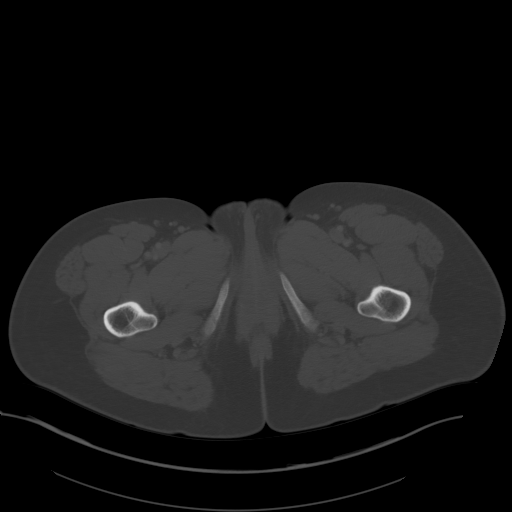
[im 16/96  soft-tissue]
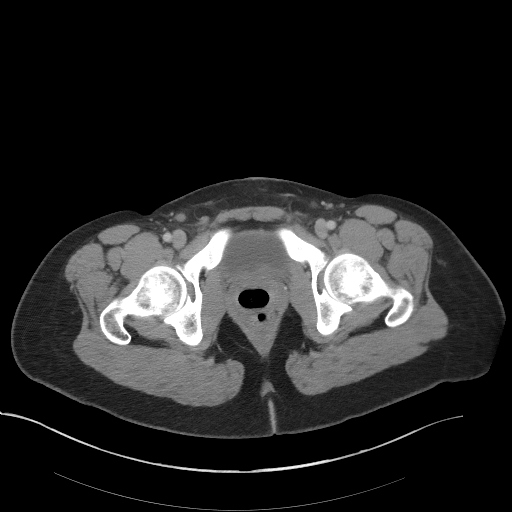
[im 21/96  soft-tissue]
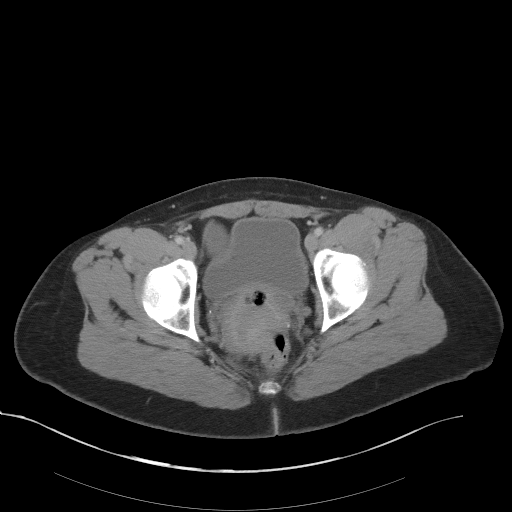
[im 31/96  soft-tissue]
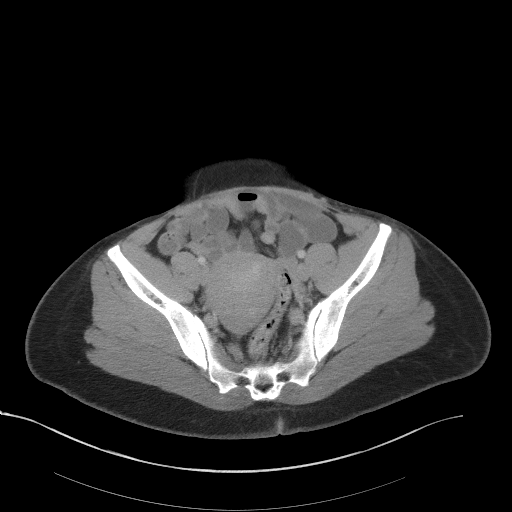
[im 36/96  soft-tissue]
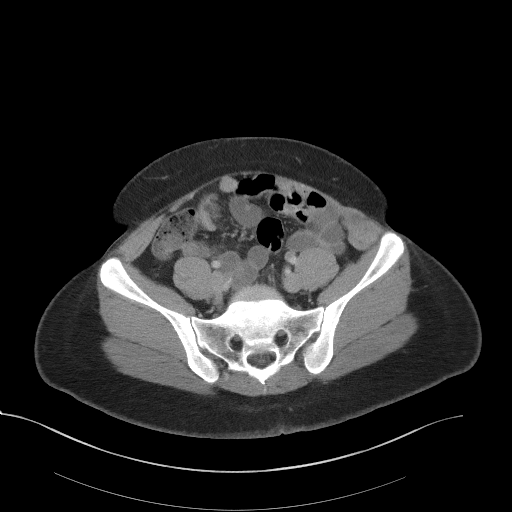
[im 46/96  soft-tissue]
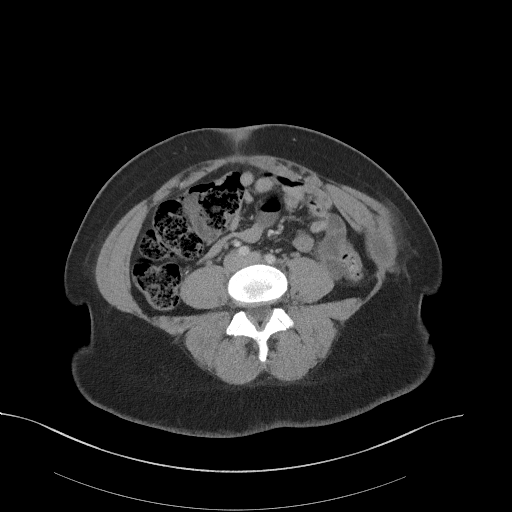
[im 51/96  soft-tissue]
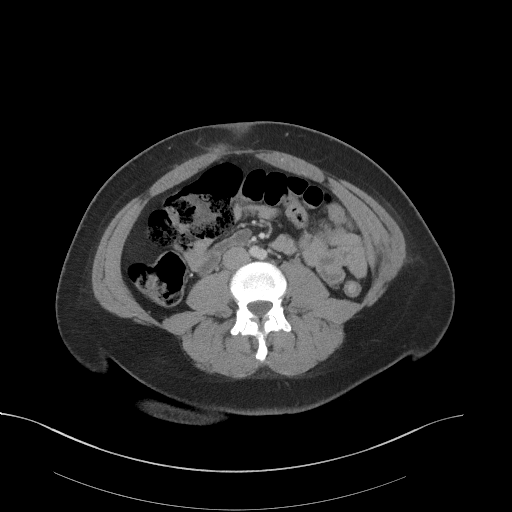
[im 61/96  soft-tissue]
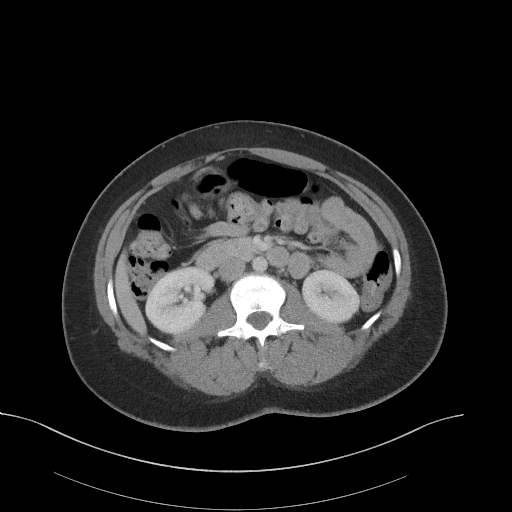
[im 66/96  soft-tissue]
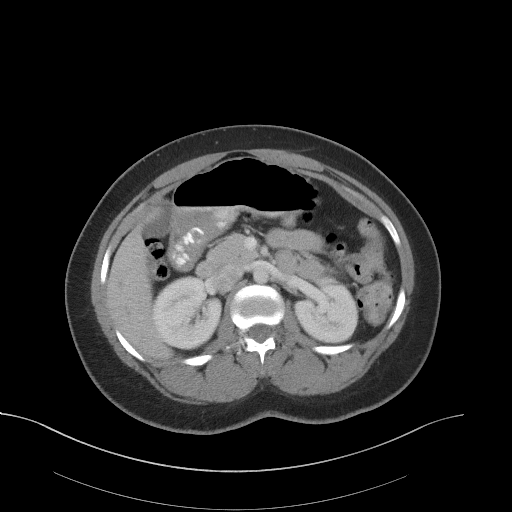
[im 66/96  bone]
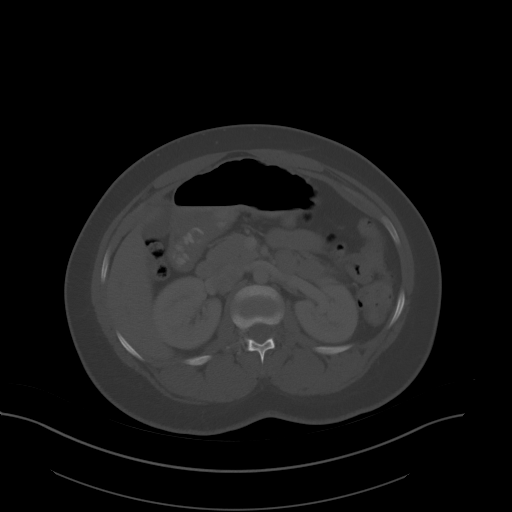
[im 76/96  soft-tissue]
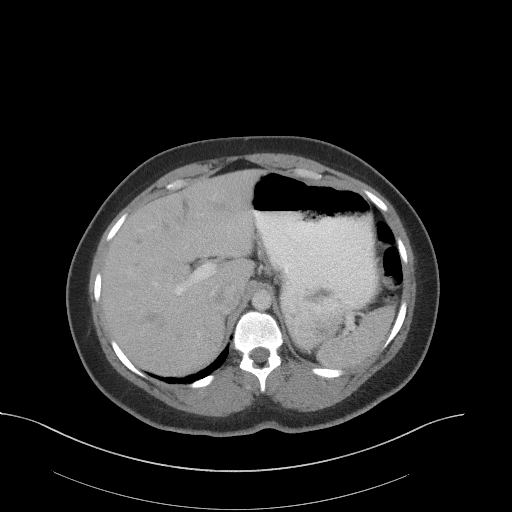
[im 81/96  soft-tissue]
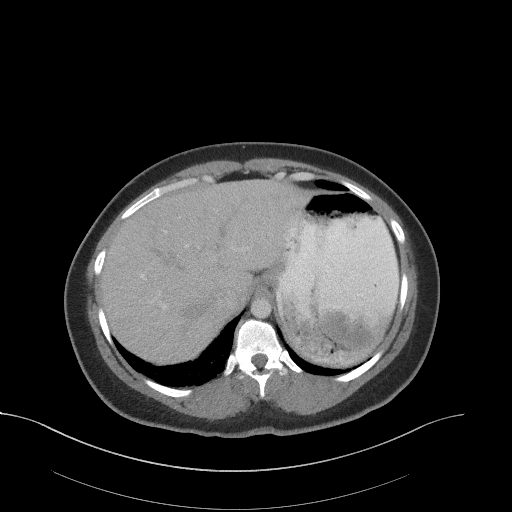
[im 91/96  soft-tissue]
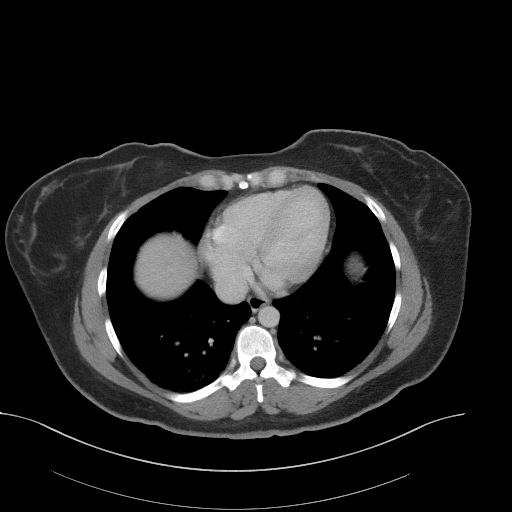

[Series 5: coronal st · coronal · 0.79mm/px · 3 of 95 slices shown]
[im 32/95  soft-tissue]
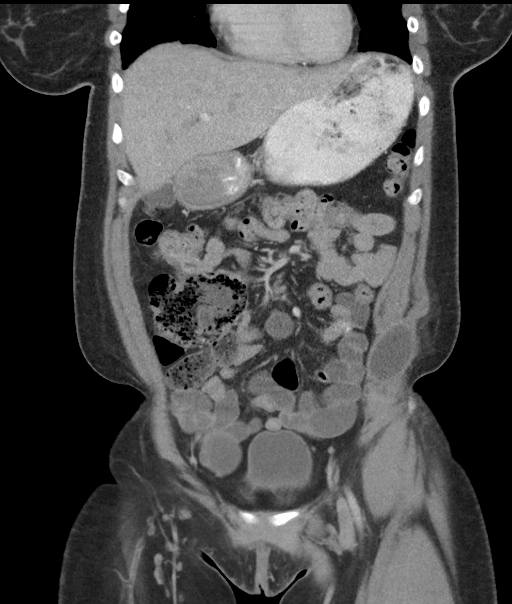
[im 42/95  soft-tissue]
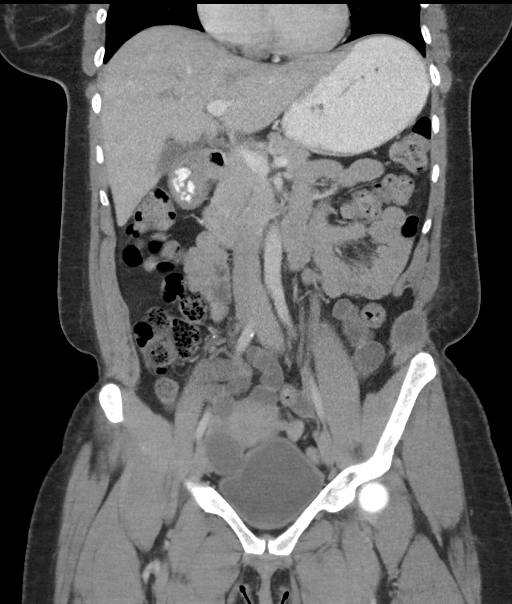
[im 53/95  soft-tissue]
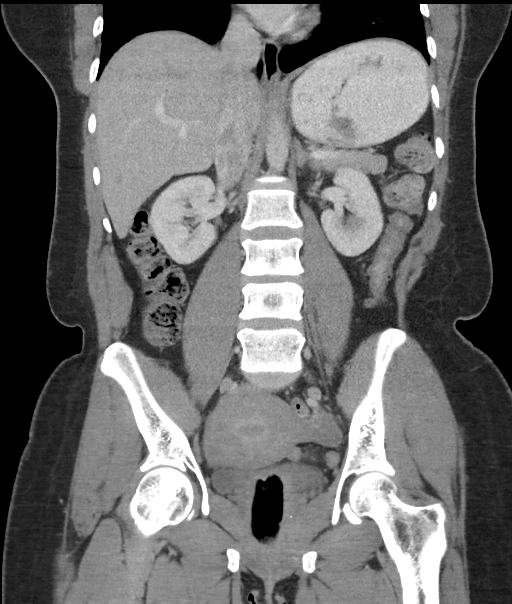

[15 of 46 positions shown; findings below may reference images not displayed]

FINDINGS: Lower chest: There bibasilar atelectatic changes/scarring. A faint 4
mm ground-glass nodular density in the right lung (series 4, image
1) is only partially visualized but may represent a nodule.

There is no intra-abdominal free air or free fluid.

Hepatobiliary: No focal liver abnormality is seen. No gallstones,
gallbladder wall thickening, or biliary dilatation.

Pancreas: Unremarkable. No pancreatic ductal dilatation or
surrounding inflammatory changes.

Spleen: Normal in size without focal abnormality.

Adrenals/Urinary Tract: The adrenal glands are unremarkable. The
kidneys, visualized ureters, and urinary bladder appear
unremarkable.

Stomach/Bowel: There is moderate amount of stool throughout the
colon. There is no evidence of bowel obstruction or active
inflammation. Normal appendix.

Vascular/Lymphatic: No significant vascular findings are present. No
enlarged abdominal or pelvic lymph nodes.

Reproductive: The uterus is anteverted. There probable small
posterior uterine fibroids. The left ovary is unremarkable. There is
a 4.7 x 3.6 cm cystic structure in the region of the right adnexa
similar to prior CT of 08/15/2016. This likely represents a para
ovarian or paratubal cyst versus a mesenteric inclusion cyst given
stability in size compared to the prior CT. A tampon is noted in the
vagina.

Other: There are multiple small stones supraumbilical fat containing
hernias as seen on the prior CT. There is probable umbilical hernia
repair mesh. There is inflammatory changes and induration of the
umbilical fat and periumbilical subcutaneous fat. This is new
compared to the prior CT. No definite umbilical hernia identified.
The inflammatory changes may be reactive or infectious in etiology
however, strangulation of a small amount of fat in the umbilicus is
not entirely excluded. Correlation with clinical exam is
recommended. There is no drainable fluid collection or abscess.

There is a 5.4 x 3.2 cm (previously 7.9 x 5.0 cm) fluid collection
in the left anterolateral pelvic wall at the level of the iliac
crest. This fluid collection is indeterminate but may represent a
postsurgical seroma.

Musculoskeletal: No acute or significant osseous findings.
IMPRESSION: 1. Inflammatory changes of the umbilicus and periumbilical
subcutaneous soft fat. This findings may be reactive or infectious
in etiology. However, strangulation of umbilical fat is not entirely
excluded. No umbilical hernia noted. No fluid collection or abscess.
2. Decrease in the size of the fluid collection in the left
anterolateral pelvic wall, likely a postoperative seroma.
3. Right adnexal cystic lesion, similar to prior CT, and likely a
paraovarian or paratubal cyst versus a peritoneal inclusion cyst.

## 2019-09-22 ENCOUNTER — Other Ambulatory Visit: Payer: Self-pay

## 2019-09-22 ENCOUNTER — Emergency Department (HOSPITAL_BASED_OUTPATIENT_CLINIC_OR_DEPARTMENT_OTHER)
Admission: EM | Admit: 2019-09-22 | Discharge: 2019-09-22 | Disposition: A | Discharge status: Home / Self Care | Payer: BC Managed Care – PPO | Attending: Emergency Medicine | Admitting: Emergency Medicine

## 2019-09-22 ENCOUNTER — Emergency Department (HOSPITAL_BASED_OUTPATIENT_CLINIC_OR_DEPARTMENT_OTHER): Payer: BC Managed Care – PPO

## 2019-09-22 ENCOUNTER — Encounter (HOSPITAL_BASED_OUTPATIENT_CLINIC_OR_DEPARTMENT_OTHER): Payer: Self-pay

## 2019-09-22 DIAGNOSIS — F17210 Nicotine dependence, cigarettes, uncomplicated: Secondary | ICD-10-CM | POA: Diagnosis not present

## 2019-09-22 DIAGNOSIS — R112 Nausea with vomiting, unspecified: Secondary | ICD-10-CM | POA: Insufficient documentation

## 2019-09-22 DIAGNOSIS — R109 Unspecified abdominal pain: Secondary | ICD-10-CM

## 2019-09-22 DIAGNOSIS — M799 Soft tissue disorder, unspecified: Secondary | ICD-10-CM | POA: Insufficient documentation

## 2019-09-22 DIAGNOSIS — R1033 Periumbilical pain: Secondary | ICD-10-CM | POA: Diagnosis present

## 2019-09-22 DIAGNOSIS — N838 Other noninflammatory disorders of ovary, fallopian tube and broad ligament: Secondary | ICD-10-CM | POA: Insufficient documentation

## 2019-09-22 DIAGNOSIS — R1013 Epigastric pain: Secondary | ICD-10-CM | POA: Diagnosis not present

## 2019-09-22 DIAGNOSIS — M7989 Other specified soft tissue disorders: Secondary | ICD-10-CM

## 2019-09-22 DIAGNOSIS — N949 Unspecified condition associated with female genital organs and menstrual cycle: Secondary | ICD-10-CM

## 2019-09-22 LAB — COMPREHENSIVE METABOLIC PANEL
ALT: 20 U/L (ref 0–44)
AST: 20 U/L (ref 15–41)
Albumin: 4 g/dL (ref 3.5–5.0)
Alkaline Phosphatase: 67 U/L (ref 38–126)
Anion gap: 7 (ref 5–15)
BUN: 9 mg/dL (ref 6–20)
CO2: 23 mmol/L (ref 22–32)
Calcium: 9.5 mg/dL (ref 8.9–10.3)
Chloride: 105 mmol/L (ref 98–111)
Creatinine, Ser: 0.88 mg/dL (ref 0.44–1.00)
GFR calc Af Amer: >60 mL/min (ref >60)
GFR calc non Af Amer: >60 mL/min (ref >60)
Glucose, Bld: 90 mg/dL (ref 70–99)
Potassium: 3.6 mmol/L (ref 3.5–5.1)
Sodium: 135 mmol/L (ref 135–145)
Total Bilirubin: 0.4 mg/dL (ref 0.3–1.2)
Total Protein: 7.5 g/dL (ref 6.5–8.1)

## 2019-09-22 LAB — URINALYSIS, ROUTINE W REFLEX MICROSCOPIC
Bilirubin Urine: NEGATIVE
Glucose, UA: NEGATIVE mg/dL
Hgb urine dipstick: NEGATIVE
Ketones, ur: NEGATIVE mg/dL
Leukocytes,Ua: NEGATIVE
Nitrite: NEGATIVE
Protein, ur: NEGATIVE mg/dL
Specific Gravity, Urine: 1.02 (ref 1.005–1.030)
pH: 7 (ref 5.0–8.0)

## 2019-09-22 LAB — PREGNANCY, URINE: Preg Test, Ur: NEGATIVE

## 2019-09-22 LAB — CBC
HCT: 40.1 % (ref 36.0–46.0)
Hemoglobin: 13.5 g/dL (ref 12.0–15.0)
MCH: 32.7 pg (ref 26.0–34.0)
MCHC: 33.7 g/dL (ref 30.0–36.0)
MCV: 97.1 fL (ref 80.0–100.0)
Platelets: 289 10*3/uL (ref 150–400)
RBC: 4.13 MIL/uL (ref 3.87–5.11)
RDW: 13.1 % (ref 11.5–15.5)
WBC: 13.5 10*3/uL — ABNORMAL HIGH (ref 4.0–10.5)
nRBC: 0 % (ref 0.0–0.2)

## 2019-09-22 LAB — LIPASE, BLOOD: Lipase: 22 U/L (ref 11–51)

## 2019-09-22 MED ORDER — DOXYCYCLINE HYCLATE 100 MG PO CAPS: 100.0000 mg | ORAL_CAPSULE | Freq: Two times a day (BID) | ORAL | 0 refills | Status: AC

## 2019-09-22 MED ORDER — IOHEXOL 300 MG/ML  SOLN
100.0000 mL | Freq: Once | INTRAMUSCULAR | Status: AC | PRN
  Administered 2019-09-22: 15:00:00 100 mL via INTRAVENOUS

## 2019-09-22 MED ORDER — NAPROXEN 500 MG PO TABS: 500.0000 mg | ORAL_TABLET | Freq: Two times a day (BID) | ORAL | 0 refills | Status: DC | PRN

## 2019-09-22 MED ORDER — SODIUM CHLORIDE 0.9% FLUSH
3.0000 mL | Freq: Once | INTRAVENOUS | Status: DC
  Filled 2019-09-22: qty 3

## 2019-09-22 MED ORDER — KETOROLAC TROMETHAMINE 15 MG/ML IJ SOLN
15.0000 mg | Freq: Once | INTRAMUSCULAR | Status: AC
  Administered 2019-09-22: 16:00:00 15 mg via INTRAVENOUS
  Filled 2019-09-22: qty 1

## 2019-09-22 NOTE — ED Triage Notes (Signed)
Pt c/o abd pain x 6 days-vomited x 1 4 days ago-denies diarrhea, urinary sx and vaginal d/c-reports hx of hernia surgery to area-NAD-steady gait

## 2019-09-22 NOTE — Discharge Instructions (Signed)
Please schedule follow-up with both your general surgeon as well as your primary care doctor.  Please discuss the CT findings today with both of your doctors.  Discuss the cyst near your ovary with your primary doctor, please discuss obtaining pelvic ultrasound for further evaluation.  Please take antibiotic as prescribed.  If you feel your pain worsens, you develop swelling, redness, fever, vomiting, return to ER for reassessment.  Recommend taking Tylenol, Motrin as needed for pain control.

## 2019-09-23 NOTE — ED Provider Notes (Signed)
[facility] EMERGENCY DEPARTMENT Provider Note   CSN: [id number] Arrival date & time: 09/22/19  1221     History Chief Complaint  Patient presents with   Abdominal Pain    Tina Bauer is a 39 y.o. female.  Presents to ER with chief complaint of abdominal pain.  Pain around her umbilicus, epigastric region.  Reports feels similar when she had an umbilical hernia.  Has had couple episodes of vomiting, last episode was a few days ago.  Nonbloody nonbilious.  No associated lower abdominal pain, pelvic pain, vaginal discharge.  Completed chart review, 2018 surgery for hernia ventral with Gerkin.  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    Past Surgical History:  Procedure Laterality Date   HERNIA REPAIR     INSERTION OF MESH N/A 05/16/2017   Procedure: INSERTION OF MESH;  Surgeon: Darnell Level, MD;  Location: [facility];  Service: General;  Laterality: N/A;   OPEN REDUCTION INTERNAL FIXATION (ORIF) DISTAL RADIAL FRACTURE Left 07/12/2016   Procedure: ORIF left distal radius fracture with repair reconstruction as necessary, possible ORIF left ulna fracture;  Surgeon: Dominica Severin, MD;  Location: [facility] OR;  Service: Orthopedics;  Laterality: Left;  90 mins   VENTRAL HERNIA REPAIR N/A 05/16/2017   Procedure: OPEN REPAIR INCARCERATED VENTRAL HERNIA WITH MESH PATCH;  Surgeon: Darnell Level, MD;  Location: [facility]S;  Service: General;  Laterality: N/A;     OB History   No obstetric history on file.     No family history on file.  Social History   Tobacco Use   Smoking status: Current Every Day Smoker    Packs/day: 0.25    Years: 15.00    Pack years: 3.75    Types: Cigarettes   Smokeless tobacco: Never Used  Substance Use Topics   Alcohol use: Yes    Comment:  ocassionally   Drug use: No    Home Medications Prior to Admission medications   Medication Sig Start Date End Date Taking? Authorizing Provider  doxycycline (VIBRAMYCIN) 100 MG capsule Take 1 capsule (100 mg total) by mouth 2 (two) times daily for 10 days. 09/22/19 10/02/19  Milagros Loll, MD  naproxen (NAPROSYN) 500 MG tablet Take 1 tablet (500 mg total) by mouth 2 (two) times daily as needed. 09/22/19   Milagros Loll, MD    Allergies    Fentanyl  Review of Systems   Review of Systems  Constitutional: Negative for chills and fever.  HENT: Negative for ear pain and sore throat.   Eyes: Negative for pain and visual disturbance.  Respiratory: Negative for cough and shortness of breath.   Cardiovascular: Negative for chest pain and palpitations.  Gastrointestinal: Positive for abdominal pain, nausea and vomiting.  Genitourinary: Negative for dysuria and hematuria.  Musculoskeletal: Negative for arthralgias and back pain.  Skin: Negative for color change and rash.  Neurological: Negative for seizures and syncope.  All other systems reviewed and are negative.   Physical Exam Updated Vital Signs BP (!) 154/89 (BP Location: Right Arm)    Pulse 63    Temp 97.9 F (36.6 C) (Oral)    Resp 18    Ht 5\' 8"  (1.727 m)    Wt 93 kg  LMP 09/08/2019    SpO2 100%    BMI 31.17 kg/m   Physical Exam Vitals and nursing note reviewed.  Constitutional:      General: She is not in acute distress.    Appearance: She is well-developed.  HENT:     Head: Normocephalic and atraumatic.  Eyes:     Conjunctiva/sclera: Conjunctivae normal.  Cardiovascular:     Rate and Rhythm: Normal rate and regular rhythm.     Heart sounds: No murmur.  Pulmonary:     Effort: Pulmonary effort is normal. No respiratory distress.     Breath sounds: Normal breath sounds.  Abdominal:     Palpations: Abdomen is soft.     Comments: TTP over epigastrum, periumbilical, no hernia palpated, no mass, no induration,  no overlying erythema  Musculoskeletal:     Cervical back: Neck supple.  Skin:    General: Skin is warm and dry.  Neurological:     Mental Status: She is alert.     ED Results / Procedures / Treatments   Labs (all labs ordered are listed, but only abnormal results are displayed) Labs Reviewed  CBC - Abnormal; Notable for the following components:      Result Value   WBC 13.5 (*)    All other components within normal limits  LIPASE, BLOOD  COMPREHENSIVE METABOLIC PANEL  URINALYSIS, ROUTINE W REFLEX MICROSCOPIC  PREGNANCY, URINE    EKG None  Radiology CT ABDOMEN PELVIS W CONTRAST  Result Date: 09/22/2019 CLINICAL DATA:  Abdominal pain for the past 6 days. EXAM: CT ABDOMEN AND PELVIS WITH CONTRAST TECHNIQUE: Multidetector CT imaging of the abdomen and pelvis was performed using the standard protocol following bolus administration of intravenous contrast. CONTRAST:  OMNIPAQUE IOHEXOL 300 MG/ML  SOLN COMPARISON:  CT abdomen pelvis dated April 21, 2017. FINDINGS: Lower chest: No acute abnormality. Hepatobiliary: No focal liver abnormality is seen. No gallstones, gallbladder wall thickening, or biliary dilatation. Pancreas: Unremarkable. No pancreatic ductal dilatation or surrounding inflammatory changes. Spleen: Normal in size without focal abnormality. Adrenals/Urinary Tract: Adrenal glands are unremarkable. 1.2 cm simple cyst in the right kidney. Slight interval increase in size of a subcentimeter low-density lesion in the upper pole of the left kidney, which remains too small to characterize. No renal calculi or hydronephrosis. The bladder is unremarkable. Stomach/Bowel: Stomach is within normal limits. No evidence of bowel wall thickening, distention, or inflammatory changes. Vascular/Lymphatic: No significant vascular findings are present. No enlarged abdominal or pelvic lymph nodes. Reproductive: Interval increase in size of a complex cystic lesion in the right adnexa, now  measuring 6.1 x 4.1 cm, previously 4.7 x 3.6 cm, and containing a thin internal septation. The uterus and left ovary are unremarkable. Other: Progressive irregular soft tissue nodularity of the umbilicus (series 2, image 50). No abdominal wall hernia. No free fluid or pneumoperitoneum. Interval decrease in size of the thick-walled fluid collection in the left lateral abdominal wall, now measuring 4.1 x 2.0 cm, previously 5.4 x 3.2 cm, likely a chronic seroma. Musculoskeletal: No acute or significant osseous findings. IMPRESSION: 1. Progressive irregular soft tissue nodularity of the umbilicus, which could reflect chronic inflammation or infection. Squamous cell carcinoma could have a similar appearance. Recommend correlation with physical exam. No recurrent hernia. 2. Interval increase in size of a complex cystic lesion in the right adnexa, now measuring 6.1 x 4.1 cm, previously 4.7 x 3.6 cm, and containing a thin internal septation. Differential considerations include ovarian cystadenoma versus paraovarian or peritoneal inclusion  cyst. Further evaluation with non-emergent pelvic ultrasound is recommended. Electronically Signed   By: Titus Dubin M.D.   On: 09/22/2019 16:02    Procedures Procedures (including critical care time)  Medications Ordered in ED Medications  iohexol (OMNIPAQUE) 300 MG/ML solution 100 mL (100 mLs Intravenous Contrast Given 09/22/19 1515)  ketorolac (TORADOL) 15 MG/ML injection 15 mg (15 mg Intravenous Given 09/22/19 1617)    ED Course  I have reviewed the triage vital signs and the nursing notes.  Pertinent labs & imaging results that were available during my care of the patient were reviewed by me and considered in my medical decision making (see chart for details).    MDM Rules/Calculators/A&P                      39 y/o lady with epigastric/periumbilical pain, prior history of ventral hernia s/p repair 2018.  CT scan ordered to rule out recurrent hernia, other acute  abdominal process.  CT negative for hernia, did demonstrate "Progressive irregular soft tissue nodularity of the umbilicus, which could reflect chronic inflammation or infection...or squamous cell carcinoma" no skin findings, no erythema.  Lower suspicion that this is an acute infection.  Recommended trial of antibiotics to cover any possible infection.  Patient also with what I suspect is incidental adnexal cyst.  Worse from prior study.  Discussed all above findings and possible differentials with patient and need for nonemergent pelvic ultrasound.  Recommend patient have follow-up with both her primary doctor and her general surgeon.  Pain well controlled, tolerating p.o. without difficulty, believe patient is appropriate discharge and outpatient management this time.    After the discussed management above, the patient was determined to be safe for discharge.  The patient was in agreement with this plan and all questions regarding their care were answered.  ED return precautions were discussed and the patient will return to the ED with any significant worsening of condition.    Final Clinical Impression(s) / ED Diagnoses Final diagnoses:  Inflammation of soft tissue  Abdominal pain, unspecified abdominal location  Adnexal cyst    Rx / DC Orders ED Discharge Orders         Ordered    doxycycline (VIBRAMYCIN) 100 MG capsule  2 times daily     09/22/19 1644    naproxen (NAPROSYN) 500 MG tablet  2 times daily PRN     09/22/19 1644           Lucrezia Starch, MD 09/23/19 1252

## 2019-12-16 ENCOUNTER — Other Ambulatory Visit: Payer: Self-pay

## 2019-12-16 ENCOUNTER — Encounter (HOSPITAL_BASED_OUTPATIENT_CLINIC_OR_DEPARTMENT_OTHER): Payer: Self-pay | Admitting: Emergency Medicine

## 2019-12-16 ENCOUNTER — Emergency Department (HOSPITAL_BASED_OUTPATIENT_CLINIC_OR_DEPARTMENT_OTHER)
Admission: EM | Admit: 2019-12-16 | Discharge: 2019-12-16 | Disposition: A | Discharge status: Home / Self Care | Payer: BC Managed Care – PPO | Attending: Emergency Medicine | Admitting: Emergency Medicine

## 2019-12-16 DIAGNOSIS — R1905 Periumbilic swelling, mass or lump: Secondary | ICD-10-CM | POA: Insufficient documentation

## 2019-12-16 DIAGNOSIS — F17210 Nicotine dependence, cigarettes, uncomplicated: Secondary | ICD-10-CM | POA: Diagnosis not present

## 2019-12-16 DIAGNOSIS — R1033 Periumbilical pain: Secondary | ICD-10-CM | POA: Diagnosis not present

## 2019-12-16 DIAGNOSIS — R1909 Other intra-abdominal and pelvic swelling, mass and lump: Secondary | ICD-10-CM

## 2019-12-16 LAB — URINALYSIS, ROUTINE W REFLEX MICROSCOPIC
Bilirubin Urine: NEGATIVE
Glucose, UA: NEGATIVE mg/dL
Hgb urine dipstick: NEGATIVE
Ketones, ur: NEGATIVE mg/dL
Leukocytes,Ua: NEGATIVE
Nitrite: NEGATIVE
Protein, ur: NEGATIVE mg/dL
Specific Gravity, Urine: 1.025 (ref 1.005–1.030)
pH: 6 (ref 5.0–8.0)

## 2019-12-16 LAB — CBC WITH DIFFERENTIAL/PLATELET
Abs Immature Granulocytes: 0.02 10*3/uL (ref 0.00–0.07)
Basophils Absolute: 0 10*3/uL (ref 0.0–0.1)
Basophils Relative: 0 %
Eosinophils Absolute: 0.5 10*3/uL (ref 0.0–0.5)
Eosinophils Relative: 5 %
HCT: 40 % (ref 36.0–46.0)
Hemoglobin: 13.4 g/dL (ref 12.0–15.0)
Immature Granulocytes: 0 %
Lymphocytes Relative: 39 %
Lymphs Abs: 4 10*3/uL (ref 0.7–4.0)
MCH: 32.5 pg (ref 26.0–34.0)
MCHC: 33.5 g/dL (ref 30.0–36.0)
MCV: 97.1 fL (ref 80.0–100.0)
Monocytes Absolute: 1 10*3/uL (ref 0.1–1.0)
Monocytes Relative: 10 %
Neutro Abs: 4.6 10*3/uL (ref 1.7–7.7)
Neutrophils Relative %: 46 %
Platelets: 276 10*3/uL (ref 150–400)
RBC: 4.12 MIL/uL (ref 3.87–5.11)
RDW: 12.8 % (ref 11.5–15.5)
WBC: 10.2 10*3/uL (ref 4.0–10.5)
nRBC: 0 % (ref 0.0–0.2)

## 2019-12-16 LAB — COMPREHENSIVE METABOLIC PANEL
ALT: 14 U/L (ref 0–44)
AST: 13 U/L — ABNORMAL LOW (ref 15–41)
Albumin: 3.9 g/dL (ref 3.5–5.0)
Alkaline Phosphatase: 67 U/L (ref 38–126)
Anion gap: 8 (ref 5–15)
BUN: 16 mg/dL (ref 6–20)
CO2: 23 mmol/L (ref 22–32)
Calcium: 8.8 mg/dL — ABNORMAL LOW (ref 8.9–10.3)
Chloride: 107 mmol/L (ref 98–111)
Creatinine, Ser: 1.14 mg/dL — ABNORMAL HIGH (ref 0.44–1.00)
GFR calc Af Amer: >60 mL/min (ref >60)
GFR calc non Af Amer: >60 mL/min (ref >60)
Glucose, Bld: 95 mg/dL (ref 70–99)
Potassium: 3.5 mmol/L (ref 3.5–5.1)
Sodium: 138 mmol/L (ref 135–145)
Total Bilirubin: 0.4 mg/dL (ref 0.3–1.2)
Total Protein: 7.1 g/dL (ref 6.5–8.1)

## 2019-12-16 LAB — PREGNANCY, URINE: Preg Test, Ur: NEGATIVE

## 2019-12-16 LAB — LIPASE, BLOOD: Lipase: 27 U/L (ref 11–51)

## 2019-12-16 MED ORDER — DOXYCYCLINE HYCLATE 100 MG PO TABS
100.0000 mg | ORAL_TABLET | Freq: Once | ORAL | Status: AC
  Administered 2019-12-16: 100 mg via ORAL
  Filled 2019-12-16: qty 1

## 2019-12-16 MED ORDER — KETOROLAC TROMETHAMINE 30 MG/ML IJ SOLN
30.0000 mg | Freq: Once | INTRAMUSCULAR | Status: AC
  Administered 2019-12-16: 30 mg via INTRAVENOUS
  Filled 2019-12-16: qty 1

## 2019-12-16 MED ORDER — DOXYCYCLINE HYCLATE 100 MG PO CAPS: 100.0000 mg | ORAL_CAPSULE | Freq: Two times a day (BID) | ORAL | 0 refills | Status: DC

## 2019-12-16 MED ORDER — NAPROXEN 500 MG PO TABS: 500.0000 mg | ORAL_TABLET | Freq: Two times a day (BID) | ORAL | 0 refills | Status: DC

## 2019-12-16 MED ORDER — HYDROCODONE-ACETAMINOPHEN 5-325 MG PO TABS: 1.0000 | ORAL_TABLET | ORAL | 0 refills | Status: DC | PRN

## 2019-12-16 MED ORDER — ONDANSETRON HCL 4 MG PO TABS: 4.0000 mg | ORAL_TABLET | Freq: Three times a day (TID) | ORAL | 0 refills | Status: DC | PRN

## 2019-12-16 NOTE — ED Triage Notes (Signed)
Pt is c/o pain at her umbilicus  Pt states this is the third day of it hurting  Pt states she has seen Dr Gerrit Friends and is supposed to be having surgery

## 2019-12-16 NOTE — Discharge Instructions (Addendum)
You were seen in the emergency department for a mass at your bellybutton.  Previous CT scans have shown that this could be an area of infection like an abscess but also could be a soft tissue mass or other lesion such as skin cancer.  I recommend that she follow-up closely with your surgeon for further evaluation.  Please take your antibiotics as prescribed.  Please follow-up if you feel your symptoms are worsening, you develop redness, warmth, bruising to this area, fever of 100.4 or higher, vomiting, or not having bowel movements or passing gas.   Steps to find a Primary Care Provider (PCP):  Call [phone] or [phone] to access "[place] Find a Doctor Service."  2.  You may also go on the [facility] website at [email or web address]  3.  [facility] also frequently accepts new patients.  [facility]  [address] [place] [address] [phone]  4.  There are also multiple [facility], [facility] and [facility] practices throughout the [place] that are frequently accepting new patients. You may find a clinic that is close to your home and contact them.  [email or web address] [phone]  [facility] [email or web address]  [email or web address] [phone]  [facility] [email or web address] [phone]  5.  Local Health Departments also can provide primary care services.  [facility]  [address] [place] [address] [phone]  [facility] Department [address] [place] [address] [phone]  [facility] [address] [place] [address]  [place] [address] [phone]

## 2019-12-16 NOTE — ED Provider Notes (Signed)
TIME SEEN: 3:00 AM  CHIEF COMPLAINT: Pain at the umbilicus  HPI: Patient is a 39 year old female who presents emergency department with pain at the umbilicus and a mass that has been present intermittently for years.  States it is progressively getting more painful and swollen.  She is followed with Dr. Gerrit Friends with general surgery and states she has plan for surgical removal but does not have a date yet.  She denies any fevers, nausea, vomiting, diarrhea, dysuria, hematuria, vaginal bleeding or discharge.  No drainage from this area.  No surrounding redness or warmth.  She states she has been told it is not a hernia.  She has had previous hernia repair in 2018.  ROS: See HPI Constitutional: no fever  Eyes: no drainage  ENT: no runny nose   Cardiovascular:  no chest pain  Resp: no SOB  GI: no vomiting GU: no dysuria Integumentary: no rash  Allergy: no hives  Musculoskeletal: no leg swelling  Neurological: no slurred speech ROS otherwise negative  PAST MEDICAL HISTORY/PAST SURGICAL HISTORY:  Past Medical History:  Diagnosis Date  . Dyspnea    occasional due to contusion of abdoman  . Headache   . Hernia, umbilical     MEDICATIONS:  Prior to Admission medications   Medication Sig Start Date End Date Taking? Authorizing Provider  naproxen (NAPROSYN) 500 MG tablet Take 1 tablet (500 mg total) by mouth 2 (two) times daily as needed. 09/22/19   Milagros Loll, MD    ALLERGIES:  Allergies  Allergen Reactions  . Fentanyl Itching    SOCIAL HISTORY:  Social History   Tobacco Use  . Smoking status: Current Every Day Smoker    Packs/day: 0.25    Years: 15.00    Pack years: 3.75    Types: Cigarettes  . Smokeless tobacco: Never Used  Substance Use Topics  . Alcohol use: Yes    Comment: ocassionally    FAMILY HISTORY: Family History  Problem Relation Age of Onset  . Hypertension Other   . Stroke Other   . Diabetes Other   . Cancer Other   . Thyroid disease Other      EXAM: BP 112/80   Pulse 70   Temp 98.2 F (36.8 C) (Oral)   Resp 16   Ht 5\' 8"  (1.727 m)   Wt 93 kg   LMP 12/05/2019 (Exact Date)   SpO2 100%   BMI 31.17 kg/m  CONSTITUTIONAL: Alert and oriented and responds appropriately to questions. Well-appearing; well-nourished HEAD: Normocephalic EYES: Conjunctivae clear, pupils appear equal, EOM appear intact ENT: normal nose; moist mucous membranes NECK: Supple, normal ROM CARD: RRR; S1 and S2 appreciated; no murmurs, no clicks, no rubs, no gallops RESP: Normal chest excursion without splinting or tachypnea; breath sounds clear and equal bilaterally; no wheezes, no rhonchi, no rales, no hypoxia or respiratory distress, speaking full sentences ABD/GI: Normal bowel sounds; non-distended; soft, small mass to the umbilicus that is hard to palpation without fluctuance, no warmth or surrounding erythema, induration.  She is tender to palpation over this nodularity but no other abdominal pain.  Otherwise abdominal exam benign and nontender. BACK:  The back appears normal EXT: Normal ROM in all joints; no deformity noted, no edema; no cyanosis SKIN: Normal color for age and race; warm; no rash on exposed skin NEURO: Moves all extremities equally PSYCH: The patient's mood and manner are appropriate.   MEDICAL DECISION MAKING: Patient here with superficial umbilical mass.  She reports it has improved previously  with Naprosyn and doxycycline and is requesting the same today.  She is being followed by general surgery for this but does not have a date for surgical removal yet.  On my exam, this is a nodular lesion to the caudal portion of the umbilicus that does not have surrounding redness, warmth and there is no fluctuance or induration.  I have reviewed her recent imaging on 09/22/2019 when she presented with the same that shows this area that could be chronic inflammation versus infection but also could be squamous cell carcinoma.  No recurrent hernia  noted.  This does not look like a hernia today.  Patient agrees that she does not feel this is a hernia.  She agrees to close follow-up with her general surgeon.  We have discussed risk and benefits of aspiration but given I do not feel a fluid collection and there is concern that this is malignancy, we have decided to defer at this time.  Will discharge again with naproxen and doxycycline given she reports this is helped her previously.  Given Toradol for pain here.  Labs, urine unremarkable.  Discussed return precautions.  At this time, I do not feel there is any life-threatening condition present. I have reviewed, interpreted and discussed all results (EKG, imaging, lab, urine as appropriate) and exam findings with patient/family. I have reviewed nursing notes and appropriate previous records.  I feel the patient is safe to be discharged home without further emergent workup and can continue workup as an outpatient as needed. Discussed usual and customary return precautions. Patient/family verbalize understanding and are comfortable with this plan.  Outpatient follow-up has been provided as needed. All questions have been answered.     Patient gave verbal permission to utilize photo for medical documentation only. The image was not stored on any personal device.    Samani Briski was evaluated in Emergency Department on 12/16/2019 for the symptoms described in the history of present illness. She was evaluated in the context of the global COVID-19 pandemic, which necessitated consideration that the patient might be at risk for infection with the SARS-CoV-2 virus that causes COVID-19. Institutional protocols and algorithms that pertain to the evaluation of patients at risk for COVID-19 are in a state of rapid change based on information released by regulatory bodies including the CDC and federal and state organizations. These policies and algorithms were followed during the patient's care in the  ED.      Janiyla Long, Layla Maw, DO 12/16/19 [phone]

## 2020-01-22 ENCOUNTER — Other Ambulatory Visit: Payer: Self-pay

## 2020-01-22 ENCOUNTER — Encounter (HOSPITAL_BASED_OUTPATIENT_CLINIC_OR_DEPARTMENT_OTHER): Payer: Self-pay | Admitting: Emergency Medicine

## 2020-01-22 ENCOUNTER — Emergency Department (HOSPITAL_BASED_OUTPATIENT_CLINIC_OR_DEPARTMENT_OTHER)
Admission: EM | Admit: 2020-01-22 | Discharge: 2020-01-22 | Disposition: A | Discharge status: Home / Self Care | Payer: BC Managed Care – PPO | Attending: Emergency Medicine | Admitting: Emergency Medicine

## 2020-01-22 DIAGNOSIS — R2232 Localized swelling, mass and lump, left upper limb: Secondary | ICD-10-CM | POA: Diagnosis not present

## 2020-01-22 DIAGNOSIS — Z5321 Procedure and treatment not carried out due to patient leaving prior to being seen by health care provider: Secondary | ICD-10-CM

## 2020-01-22 DIAGNOSIS — M67439 Ganglion, unspecified wrist: Secondary | ICD-10-CM | POA: Insufficient documentation

## 2020-01-22 NOTE — ED Triage Notes (Signed)
Pt states she has a knot on her left wrist  Pt states she just noticed it tonight

## 2020-02-02 ENCOUNTER — Other Ambulatory Visit: Payer: Self-pay

## 2020-02-02 ENCOUNTER — Emergency Department (HOSPITAL_BASED_OUTPATIENT_CLINIC_OR_DEPARTMENT_OTHER)
Admission: EM | Admit: 2020-02-02 | Discharge: 2020-02-02 | Disposition: A | Discharge status: Home / Self Care | Payer: BC Managed Care – PPO | Attending: Emergency Medicine | Admitting: Emergency Medicine

## 2020-02-02 ENCOUNTER — Encounter (HOSPITAL_BASED_OUTPATIENT_CLINIC_OR_DEPARTMENT_OTHER): Payer: Self-pay | Admitting: Emergency Medicine

## 2020-02-02 DIAGNOSIS — R1905 Periumbilic swelling, mass or lump: Secondary | ICD-10-CM | POA: Insufficient documentation

## 2020-02-02 DIAGNOSIS — F17210 Nicotine dependence, cigarettes, uncomplicated: Secondary | ICD-10-CM | POA: Diagnosis not present

## 2020-02-02 DIAGNOSIS — R1033 Periumbilical pain: Secondary | ICD-10-CM | POA: Diagnosis present

## 2020-02-02 DIAGNOSIS — R1909 Other intra-abdominal and pelvic swelling, mass and lump: Secondary | ICD-10-CM

## 2020-02-02 MED ORDER — NAPROXEN 375 MG PO TABS: 375.0000 mg | ORAL_TABLET | Freq: Two times a day (BID) | ORAL | 0 refills | Status: AC

## 2020-02-02 MED ORDER — DOXYCYCLINE HYCLATE 100 MG PO CAPS: 100.0000 mg | ORAL_CAPSULE | Freq: Two times a day (BID) | ORAL | 0 refills | Status: DC

## 2020-02-02 MED FILL — DOXYCYCLINE HYCLATE 100 MG: 100 | 7 days supply | Qty: 14 | Fill #0

## 2020-02-02 MED FILL — NAPROXEN 375 MG TABLET: 375 | 10 days supply | Qty: 20 | Fill #0

## 2020-02-02 NOTE — ED Provider Notes (Signed)
[facility] EMERGENCY DEPARTMENT Provider Note   CSN: [id number] Arrival date & time: 02/02/20  5102     History Chief Complaint  Patient presents with  . Umbilicus Pain    Tina Bauer is a 39 y.o. female.  HPI 39 year old female presents with umbilical pain.  In 2018 she had incarcerated ventral hernia repair.  Now she has had a mass at her umbilicus that she states needs to be removed.  However she owes some money to the surgeons office right now.  Over the last week or so she has noticed increasing pain and bloating at this site.  Might be a little darker than normal.  No fevers or vomiting.  No other abdominal pain besides at this mass.  Previously treatments like doxycycline and naproxen have worked.   Past Medical History:  Diagnosis Date  . Dyspnea    occasional due to contusion of abdoman  . Headache   . Hernia, umbilical     Patient Active Problem List   Diagnosis Date Noted  . Incarcerated ventral hernia 05/13/2017    Past Surgical History:  Procedure Laterality Date  . HERNIA REPAIR    . INSERTION OF MESH N/A 05/16/2017   Procedure: INSERTION OF MESH;  Surgeon: Darnell Level, MD;  Location: [facility];  Service: General;  Laterality: N/A;  . OPEN REDUCTION INTERNAL FIXATION (ORIF) DISTAL RADIAL FRACTURE Left 07/12/2016   Procedure: ORIF left distal radius fracture with repair reconstruction as necessary, possible ORIF left ulna fracture;  Surgeon: Dominica Severin, MD;  Location: [facility] OR;  Service: Orthopedics;  Laterality: Left;  90 mins  . VENTRAL HERNIA REPAIR N/A 05/16/2017   Procedure: OPEN REPAIR INCARCERATED VENTRAL HERNIA WITH MESH PATCH;  Surgeon: Darnell Level, MD;  Location: [facility];  Service: General;  Laterality: N/A;     OB History   No obstetric history on file.     Family History  Problem Relation Age of Onset  . Hypertension Other   . Stroke Other   . Diabetes Other   . Cancer Other   . Thyroid disease Other     Social History    Tobacco Use  . Smoking status: Current Every Day Smoker    Packs/day: 0.25    Years: 15.00    Pack years: 3.75    Types: Cigarettes  . Smokeless tobacco: Never Used  Vaping Use  . Vaping Use: Some days  . Substances: THC, CBD  Substance Use Topics  . Alcohol use: Yes    Comment: ocassionally  . Drug use: Yes    Types: Marijuana    Home Medications Prior to Admission medications   Medication Sig Start Date End Date Taking? Authorizing Provider  doxycycline (VIBRAMYCIN) 100 MG capsule Take 1 capsule (100 mg total) by mouth 2 (two) times daily. 02/02/20   Pricilla Loveless, MD  naproxen (NAPROSYN) 375 MG tablet Take 1 tablet (375 mg total) by mouth 2 (two) times daily. 02/02/20   Pricilla Loveless, MD    Allergies    Fentanyl  Review of Systems   Review of Systems  Constitutional: Negative for fever.  Gastrointestinal: Positive for abdominal pain. Negative for vomiting.  All other systems reviewed and are negative.   Physical Exam Updated Vital Signs BP 120/86 (BP Location: Right Arm)   Pulse 76   Temp 98.7 F (37.1 C) (Oral)   Resp 18   Ht 5\' 8"  (1.727 m)   Wt 93 kg   LMP 01/17/2020 (Approximate)   SpO2 99%  BMI 31.17 kg/m   Physical Exam Vitals and nursing note reviewed.  Constitutional:      Appearance: She is well-developed.  HENT:     Head: Normocephalic and atraumatic.     Right Ear: External ear normal.     Left Ear: External ear normal.     Nose: Nose normal.  Eyes:     General:        Right eye: No discharge.        Left eye: No discharge.  Cardiovascular:     Rate and Rhythm: Normal rate and regular rhythm.     Heart sounds: Normal heart sounds.  Pulmonary:     Effort: Pulmonary effort is normal.     Breath sounds: Normal breath sounds.  Abdominal:     Palpations: Abdomen is soft.     Tenderness: There is abdominal tenderness.     Comments: See picture. Firm swollen umbilicus that is darkened. Tender to the umbilicus only.   Skin:     General: Skin is warm and dry.  Neurological:     Mental Status: She is alert.  Psychiatric:        Mood and Affect: Mood is not anxious.       ED Results / Procedures / Treatments   Labs (all labs ordered are listed, but only abnormal results are displayed) Labs Reviewed - No data to display  EKG None  Radiology No results found.  Procedures Procedures (including critical care time)  Medications Ordered in ED Medications - No data to display  ED Course  I have reviewed the triage vital signs and the nursing notes.  Pertinent labs & imaging results that were available during my care of the patient were reviewed by me and considered in my medical decision making (see chart for details).    MDM Rules/Calculators/A&P                          The mass is pretty firm but this seems unlikely to be a recurrent incarcerated hernia.  This is a very similar presentation to July 2021.  Umbilicus appears almost exactly the same as photo in chart from 7/7. I have stressed the importance of following up with her surgeon and will give her antibiotics and anti-inflammatories, but otherwise my suspicion is pretty low that this is a incarcerated hernia I do not think emergent imaging such as CT is needed. Final Clinical Impression(s) / ED Diagnoses Final diagnoses:  Umbilical mass    Rx / DC Orders ED Discharge Orders         Ordered    doxycycline (VIBRAMYCIN) 100 MG capsule  2 times daily        02/02/20 0745    naproxen (NAPROSYN) 375 MG tablet  2 times daily        02/02/20 0745           Pricilla Loveless, MD 02/02/20 [phone]

## 2020-02-02 NOTE — ED Triage Notes (Signed)
Pt arrives POV with c/o navel pain and "protruding. Pt endorses needing surgery, denies hernia. Pt denies injury, endorses feeling "bloated"

## 2020-02-02 NOTE — Discharge Instructions (Addendum)
It is very important to follow-up with your surgeon, as this umbilical mass likely needs to be removed.  Call Dr. Ardine Eng office today  If you develop worsening, continued, or recurrent abdominal pain, uncontrolled vomiting, fever, chest or back pain, or any other new/concerning symptoms then return to the ER for evaluation.   You are being prescribed naproxen. Do not take Ibuprofen/Advil/Aleve/Motrin/Goody Powders/BC powders/Meloxicam/Diclofenac/Indomethacin and other Nonsteroidal anti-inflammatory medications

## 2020-02-02 NOTE — ED Notes (Signed)
Pt discharged to home. Discharge instructions have been discussed with patient and/or family members. Pt verbally acknowledges understanding d/c instructions, and endorses comprehension to checkout at registration before leaving.  °

## 2020-04-02 ENCOUNTER — Other Ambulatory Visit: Payer: Self-pay

## 2020-04-02 ENCOUNTER — Encounter (HOSPITAL_BASED_OUTPATIENT_CLINIC_OR_DEPARTMENT_OTHER): Payer: Self-pay | Admitting: Emergency Medicine

## 2020-04-02 ENCOUNTER — Emergency Department (HOSPITAL_BASED_OUTPATIENT_CLINIC_OR_DEPARTMENT_OTHER)
Admission: EM | Admit: 2020-04-02 | Discharge: 2020-04-02 | Disposition: A | Discharge status: Home / Self Care | Payer: BC Managed Care – PPO | Attending: Emergency Medicine | Admitting: Emergency Medicine

## 2020-04-02 DIAGNOSIS — K0889 Other specified disorders of teeth and supporting structures: Secondary | ICD-10-CM | POA: Diagnosis present

## 2020-04-02 DIAGNOSIS — R600 Localized edema: Secondary | ICD-10-CM | POA: Insufficient documentation

## 2020-04-02 DIAGNOSIS — R220 Localized swelling, mass and lump, head: Secondary | ICD-10-CM

## 2020-04-02 DIAGNOSIS — F17210 Nicotine dependence, cigarettes, uncomplicated: Secondary | ICD-10-CM | POA: Diagnosis not present

## 2020-04-02 MED ORDER — CLINDAMYCIN HCL 300 MG PO CAPS: 300.0000 mg | ORAL_CAPSULE | Freq: Three times a day (TID) | ORAL | 0 refills | Status: AC

## 2020-04-02 NOTE — ED Provider Notes (Signed)
Emergency Department Provider Note   I have reviewed the triage vital signs and the nursing notes.   HISTORY  Chief Complaint Facial Swelling   HPI Tina Bauer is a 39 y.o. female presents to the emergency department with right-sided dental pain after extraction of a right upper molar.  Patient is 3 days out from the procedure.  She was sent home with amoxicillin which she has been taking.  She also had an extraction done on the left which is not giving her significant symptoms.  She is noticed mild swelling in the right face which is since increased.  Her pain is also up slightly.  She is not having difficulty speaking, breathing, voice changes.  No fevers or chills.  No bleeding or discharge.  Her dental practice is not open given that it is the weekend. No radiation of symptoms or modifying factors.    Past Medical History:  Diagnosis Date  . Dyspnea    occasional due to contusion of abdoman  . Headache   . Hernia, umbilical     Patient Active Problem List   Diagnosis Date Noted  . Incarcerated ventral hernia 05/13/2017    Past Surgical History:  Procedure Laterality Date  . HERNIA REPAIR    . INSERTION OF MESH N/A 05/16/2017   Procedure: INSERTION OF MESH;  Surgeon: Darnell Level, MD;  Location: [facility];  Service: General;  Laterality: N/A;  . OPEN REDUCTION INTERNAL FIXATION (ORIF) DISTAL RADIAL FRACTURE Left 07/12/2016   Procedure: ORIF left distal radius fracture with repair reconstruction as necessary, possible ORIF left ulna fracture;  Surgeon: Dominica Severin, MD;  Location: [facility] OR;  Service: Orthopedics;  Laterality: Left;  90 mins  . VENTRAL HERNIA REPAIR N/A 05/16/2017   Procedure: OPEN REPAIR INCARCERATED VENTRAL HERNIA WITH MESH PATCH;  Surgeon: Darnell Level, MD;  Location: [facility];  Service: General;  Laterality: N/A;    Allergies Fentanyl  Family History  Problem Relation Age of Onset  . Hypertension Other   . Stroke Other   . Diabetes Other   . Cancer  Other   . Thyroid disease Other     Social History Social History   Tobacco Use  . Smoking status: Current Every Day Smoker    Packs/day: 0.25    Years: 15.00    Pack years: 3.75    Types: Cigarettes  . Smokeless tobacco: Never Used  Vaping Use  . Vaping Use: Some days  . Substances: THC, CBD  Substance Use Topics  . Alcohol use: Yes    Comment: ocassionally  . Drug use: Yes    Types: Marijuana    Review of Systems  Constitutional: No fever/chills Eyes: No visual changes.  ENT: No sore throat. Positive right face swelling and mild dental pain.  Cardiovascular: Denies chest pain. Skin: Negative for rash. Neurological: Negative for headaches.  10-point ROS otherwise negative.  ____________________________________________   PHYSICAL EXAM:  VITAL SIGNS: ED Triage Vitals  Enc Vitals Group     BP 04/02/20 1126 122/90     Pulse Rate 04/02/20 1126 68     Resp 04/02/20 1126 16     Temp 04/02/20 1126 98 F (36.7 C)     Temp Source 04/02/20 1126 Oral     SpO2 04/02/20 1126 100 %     Weight 04/02/20 1125 205 lb (93 kg)     Height 04/02/20 1125 5\' 8"  (1.727 m)   Constitutional: Alert and oriented. Well appearing and in no acute distress. Eyes:  Conjunctivae are normal.  Head: Atraumatic. Nose: No congestion/rhinnorhea. Mouth/Throat: Mucous membranes are moist.  Oropharynx non-erythematous.  Soft submandibular compartment.  Patient is speaking in a clear voice.  No tongue swelling.  Dental extraction sites are well-appearing.  Exam not consistent with dry socket at this time.  There is mild ulceration along the cheek surface on the right but no appreciable abscess. No bleeding or discharge.  Neck: No stridor. Cardiovascular: Normal rate, regular rhythm.  Respiratory: Normal respiratory effort. Gastrointestinal: No distention.  Musculoskeletal: No gross deformities of extremities. Neurologic:  Normal speech and language.  Skin:  Skin is warm, dry and intact. No rash  noted.  ____________________________________________   PROCEDURES  Procedure(s) performed:   Procedures  None  ____________________________________________   INITIAL IMPRESSION / ASSESSMENT AND PLAN / ED COURSE  Pertinent labs & imaging results that were available during my care of the patient were reviewed by me and considered in my medical decision making (see chart for details).   Patient presents emergency department for evaluation of swelling to the right face.  I did not appreciate any sign of deeper space infection on exam.  Exam is not consistent with dry socket.  I do plan to escalate the patient's antibiotic course over the weekend and will have her call her dentist first thing on Monday for reevaluation appointment.  Discussed ED return precautions in detail.   ____________________________________________  FINAL CLINICAL IMPRESSION(S) / ED DIAGNOSES  Final diagnoses:  Pain, dental  Swelling of right side of face    NEW OUTPATIENT MEDICATIONS STARTED DURING THIS VISIT:  Discharge Medication List as of 04/02/2020 11:39 AM    START taking these medications   Details  clindamycin (CLEOCIN) 300 MG capsule Take 1 capsule (300 mg total) by mouth 3 (three) times daily for 7 days., Starting Sat 04/02/2020, Until Sat 04/09/2020, Normal        Note:  This document was prepared using Dragon voice recognition software and may include unintentional dictation errors.  Alona Bene, MD, [facility]    Yadhira Mckneely, Arlyss Repress, MD 04/02/20 [phone]

## 2020-04-02 NOTE — ED Notes (Signed)
ED Provider at bedside. 

## 2020-04-02 NOTE — Discharge Instructions (Signed)
You were seen in the ED today with dental pain and swelling.  I am starting a new antibiotic in place of your amoxicillin.  Please call the dentist first thing on Monday to make them aware of your symptoms.  Return to the emergency department with any worsening pain, swelling, voice change, difficulty breathing, difficulty swallowing.

## 2020-04-02 NOTE — ED Triage Notes (Signed)
She had a tooth pulled on upper R on Thursday. C/o facial swelling.

## 2020-09-29 ENCOUNTER — Emergency Department (HOSPITAL_BASED_OUTPATIENT_CLINIC_OR_DEPARTMENT_OTHER)
Admission: EM | Admit: 2020-09-29 | Discharge: 2020-09-29 | Disposition: A | Discharge status: Home / Self Care | Payer: BC Managed Care – PPO | Attending: Emergency Medicine | Admitting: Emergency Medicine

## 2020-09-29 ENCOUNTER — Other Ambulatory Visit: Payer: Self-pay

## 2020-09-29 ENCOUNTER — Encounter (HOSPITAL_BASED_OUTPATIENT_CLINIC_OR_DEPARTMENT_OTHER): Payer: Self-pay | Admitting: *Deleted

## 2020-09-29 DIAGNOSIS — R590 Localized enlarged lymph nodes: Secondary | ICD-10-CM | POA: Insufficient documentation

## 2020-09-29 DIAGNOSIS — L0501 Pilonidal cyst with abscess: Secondary | ICD-10-CM | POA: Diagnosis present

## 2020-09-29 DIAGNOSIS — F17210 Nicotine dependence, cigarettes, uncomplicated: Secondary | ICD-10-CM | POA: Diagnosis not present

## 2020-09-29 MED ORDER — LIDOCAINE HCL (PF) 1 % IJ SOLN: 30.0000 mL | Freq: Once | INTRAMUSCULAR | Status: DC

## 2020-09-29 MED ORDER — LIDOCAINE-EPINEPHRINE 2 %-1:100000 IJ SOLN: 20.0000 mL | Freq: Once | INTRAMUSCULAR | Status: DC

## 2020-09-29 MED ORDER — LIDOCAINE-EPINEPHRINE (PF) 1 %-1:200000 IJ SOLN
20.0000 mL | Freq: Once | INTRAMUSCULAR | Status: AC
  Administered 2020-09-29: 20 mL via INTRADERMAL
  Filled 2020-09-29: qty 30

## 2020-09-29 NOTE — ED Triage Notes (Signed)
Pt reports abscess above rectum x 1 week , also c/o body aches , bil groin swelling/ " knots" also c/o dizziness

## 2020-09-29 NOTE — Discharge Instructions (Addendum)
INCISION AND DRAINAGE WOUND CARE Please have your packing removed in 2-3 days or sooner if you have concerns. You may do this at any available urgent care or at your primary care doctor's office.  Keep area clean and dry for 24 hours. Do not remove bandage, if applied.  After 24 hours, remove bandage and wash wound gently with mild soap and warm water. Reapply a new bandage after cleaning wound, if directed.  Continue daily cleansing with soap and water   Seek medical careif you experience any of the following signs of infection: Swelling, redness, pus drainage, streaking, fever >101.0 F  Seek care if you experience excessive bleeding that does not stop after 15-20 minutes of constant, firm pressure.   

## 2020-09-29 NOTE — ED Provider Notes (Signed)
[facility] EMERGENCY DEPARTMENT Provider Note   CSN: [id number] Arrival date & time: 09/29/20  1446     History Chief Complaint  Patient presents with  . Abscess    Tina Bauer is a 40 y.o. female who presents with recurrent pilonidal abscess.  She had onset of pain 3 days ago.  She has had it incised and drained once before and had it opened and drained on its own a second time.  She denies fevers or chills.  She has noticed inguinal lymphadenopathy that is tender.  HPI     Past Medical History:  Diagnosis Date  . Dyspnea    occasional due to contusion of abdoman  . Headache   . Hernia, umbilical     Patient Active Problem List   Diagnosis Date Noted  . Incarcerated ventral hernia 05/13/2017    Past Surgical History:  Procedure Laterality Date  . HERNIA REPAIR    . INSERTION OF MESH N/A 05/16/2017   Procedure: INSERTION OF MESH;  Surgeon: Darnell Level, MD;  Location: [facility];  Service: General;  Laterality: N/A;  . OPEN REDUCTION INTERNAL FIXATION (ORIF) DISTAL RADIAL FRACTURE Left 07/12/2016   Procedure: ORIF left distal radius fracture with repair reconstruction as necessary, possible ORIF left ulna fracture;  Surgeon: Dominica Severin, MD;  Location: [facility] OR;  Service: Orthopedics;  Laterality: Left;  90 mins  . VENTRAL HERNIA REPAIR N/A 05/16/2017   Procedure: OPEN REPAIR INCARCERATED VENTRAL HERNIA WITH MESH PATCH;  Surgeon: Darnell Level, MD;  Location: [facility];  Service: General;  Laterality: N/A;     OB History   No obstetric history on file.     Family History  Problem Relation Age of Onset  . Hypertension Other   . Stroke Other   . Diabetes Other   . Cancer Other   . Thyroid disease Other     Social History   Tobacco Use  . Smoking status: Current Every Day Smoker    Packs/day: 0.25    Years: 15.00    Pack years: 3.75    Types: Cigarettes  . Smokeless tobacco: Never Used  Vaping Use  . Vaping Use: Some days  . Substances: THC, CBD   Substance Use Topics  . Alcohol use: Yes    Comment: ocassionally  . Drug use: Yes    Types: Marijuana    Home Medications Prior to Admission medications   Medication Sig Start Date End Date Taking? Authorizing Provider  doxycycline (VIBRAMYCIN) 100 MG capsule Take 1 capsule (100 mg total) by mouth 2 (two) times daily. 02/02/20   Pricilla Loveless, MD  naproxen (NAPROSYN) 375 MG tablet Take 1 tablet (375 mg total) by mouth 2 (two) times daily. 02/02/20   Pricilla Loveless, MD    Allergies    Fentanyl  Review of Systems   Review of Systems Ten systems reviewed and are negative for acute change, except as noted in the HPI.   Physical Exam Updated Vital Signs BP 122/86   Pulse 91   Temp 98.1 F (36.7 C) (Oral)   Resp 16   Ht 5\' 8"  (1.727 m)   Wt 89.4 kg   LMP 08/31/2020   SpO2 100%   BMI 29.95 kg/m   Physical Exam Vitals and nursing note reviewed.  Constitutional:      General: She is not in acute distress.    Appearance: She is well-developed. She is not diaphoretic.  HENT:     Head: Normocephalic and atraumatic.  Eyes:  General: No scleral icterus.    Conjunctiva/sclera: Conjunctivae normal.  Cardiovascular:     Rate and Rhythm: Normal rate and regular rhythm.     Heart sounds: Normal heart sounds. No murmur heard. No friction rub. No gallop.   Pulmonary:     Effort: Pulmonary effort is normal. No respiratory distress.     Breath sounds: Normal breath sounds.  Abdominal:     General: Bowel sounds are normal. There is no distension.     Palpations: Abdomen is soft. There is no mass.     Tenderness: There is no abdominal tenderness. There is no guarding.  Musculoskeletal:     Cervical back: Normal range of motion.  Skin:    General: Skin is warm and dry.     Comments: Tender, fluctuant abscess at the superior aspect of the gluteal cleft  Neurological:     Mental Status: She is alert and oriented to person, place, and time.  Psychiatric:        Behavior:  Behavior normal.     ED Results / Procedures / Treatments   Labs (all labs ordered are listed, but only abnormal results are displayed) Labs Reviewed - No data to display  EKG None  Radiology No results found.  Procedures .Marland KitchenIncision and Drainage  Date/Time: 09/29/2020 5:19 PM Performed by: Arthor Captain, PA-C Authorized by: Arthor Captain, PA-C   Consent:    Consent obtained:  Verbal   Consent given by:  Patient   Risks discussed:  Bleeding, incomplete drainage, pain and damage to other organs   Alternatives discussed:  No treatment Universal protocol:    Procedure explained and questions answered to patient or proxy's satisfaction: yes     Relevant documents present and verified: yes     Test results available : yes     Imaging studies available: yes     Required blood products, implants, devices, and special equipment available: yes     Site/side marked: yes     Immediately prior to procedure, a time out was called: yes     Patient identity confirmed:  Verbally with patient Location:    Type:  Abscess Pre-procedure details:    Skin preparation:  Betadine Anesthesia:    Anesthesia method:  Local infiltration   Local anesthetic:  Lidocaine 2% WITH epi Procedure type:    Complexity:  Complex Procedure details:    Ultrasound guidance: yes     Needle aspiration: yes     Needle size:  18 G   Incision types:  Single straight   Incision depth:  Subcutaneous   Wound management:  Probed and deloculated, irrigated with saline and extensive cleaning   Drainage:  Purulent   Drainage amount:  Moderate   Packing materials:  1/4 in iodoform gauze   Amount 1/4" iodoform:  3 " Post-procedure details:    Procedure completion:  Tolerated well, no immediate complications    Medications Ordered in ED Medications  lidocaine-EPINEPHrine (XYLOCAINE-EPINEPHrine) 1 %-1:200000 (PF) injection 20 mL (20 mLs Intradermal Given by Other 09/29/20 1601)    ED Course  I have reviewed  the triage vital signs and the nursing notes.  Pertinent labs & imaging results that were available during my care of the patient were reviewed by me and considered in my medical decision making (see chart for details).    MDM Rules/Calculators/A&P                          Tina  Bauer presents with abscess. There is no area of retained pus after procedure. The presentation of Tina Bauer is NOT consistent with necrotizing fascitis or osteomyolitis. There is no evidence of retained foreign body, neurovascular or tendon injury. The presentation of Tina Bauer is NOT consistent with sepsis and/or bacteremia.   Strict return and follow-up precautions have been given by me personally or by detailed written instructions verbalized by nursing staff using the teach back method to the patient/family/caregiver(s).  Data Reviewed/Counseling: I have reviewed the patient's vital signs, nursing notes, and other relevant tests/information. I had a detailed discussion regarding the historical points, exam findings, and any diagnostic results supporting the discharge diagnosis. I also discussed the need for outpatient follow-up and the need to return to the ED if symptoms worsen or if there are any questions or concerns that arise at home.  Final Clinical Impression(s) / ED Diagnoses Final diagnoses:  None    Rx / DC Orders ED Discharge Orders    None       Arthor Captain, PA-C 09/29/20 1720    Terrilee Files, MD 09/30/20 1015

## 2020-10-02 ENCOUNTER — Emergency Department (HOSPITAL_BASED_OUTPATIENT_CLINIC_OR_DEPARTMENT_OTHER)
Admission: EM | Admit: 2020-10-02 | Discharge: 2020-10-02 | Disposition: A | Discharge status: Home / Self Care | Payer: BC Managed Care – PPO | Attending: Emergency Medicine | Admitting: Emergency Medicine

## 2020-10-02 ENCOUNTER — Other Ambulatory Visit: Payer: Self-pay

## 2020-10-02 ENCOUNTER — Encounter (HOSPITAL_BASED_OUTPATIENT_CLINIC_OR_DEPARTMENT_OTHER): Payer: Self-pay | Admitting: Emergency Medicine

## 2020-10-02 DIAGNOSIS — Z5321 Procedure and treatment not carried out due to patient leaving prior to being seen by health care provider: Secondary | ICD-10-CM | POA: Insufficient documentation

## 2020-10-02 DIAGNOSIS — Z4800 Encounter for change or removal of nonsurgical wound dressing: Secondary | ICD-10-CM | POA: Insufficient documentation

## 2020-10-02 NOTE — ED Triage Notes (Signed)
Pt arrives pov with reports of needing packing removed from rectal area after abscess drainage x 4 days pta. Pt went to UC would not remove packing

## 2020-10-03 ENCOUNTER — Emergency Department (HOSPITAL_BASED_OUTPATIENT_CLINIC_OR_DEPARTMENT_OTHER)
Admission: EM | Admit: 2020-10-03 | Discharge: 2020-10-03 | Disposition: A | Discharge status: Home / Self Care | Payer: BC Managed Care – PPO | Attending: Emergency Medicine | Admitting: Emergency Medicine

## 2020-10-03 ENCOUNTER — Encounter (HOSPITAL_BASED_OUTPATIENT_CLINIC_OR_DEPARTMENT_OTHER): Payer: Self-pay

## 2020-10-03 ENCOUNTER — Other Ambulatory Visit: Payer: Self-pay

## 2020-10-03 DIAGNOSIS — F17210 Nicotine dependence, cigarettes, uncomplicated: Secondary | ICD-10-CM | POA: Diagnosis not present

## 2020-10-03 DIAGNOSIS — Z4800 Encounter for change or removal of nonsurgical wound dressing: Secondary | ICD-10-CM | POA: Insufficient documentation

## 2020-10-03 DIAGNOSIS — Z5189 Encounter for other specified aftercare: Secondary | ICD-10-CM

## 2020-10-03 NOTE — ED Triage Notes (Signed)
Pt states she is here for packing removal-NAD-steady gait

## 2020-10-03 NOTE — ED Provider Notes (Signed)
[facility] EMERGENCY DEPARTMENT Provider Note   CSN: [id number] Arrival date & time: 10/03/20  1104     History Chief Complaint  Patient presents with  . Follow-up    Tina Bauer is a 40 y.o. female.  She is here for a wound check from an I&D of a pilonidal abscess done 4 days ago.  She said she is here to get the packing replaced.  She has no complaints.  The history is provided by the patient.  Wound Check This is a recurrent problem. The problem has been rapidly improving. Pertinent negatives include no chest pain, no abdominal pain, no headaches and no shortness of breath. Nothing aggravates the symptoms. Nothing relieves the symptoms. She has tried nothing for the symptoms. The treatment provided moderate relief.       Past Medical History:  Diagnosis Date  . Dyspnea    occasional due to contusion of abdoman  . Headache   . Hernia, umbilical     Patient Active Problem List   Diagnosis Date Noted  . Incarcerated ventral hernia 05/13/2017    Past Surgical History:  Procedure Laterality Date  . HERNIA REPAIR    . INSERTION OF MESH N/A 05/16/2017   Procedure: INSERTION OF MESH;  Surgeon: Darnell Level, MD;  Location: [facility];  Service: General;  Laterality: N/A;  . OPEN REDUCTION INTERNAL FIXATION (ORIF) DISTAL RADIAL FRACTURE Left 07/12/2016   Procedure: ORIF left distal radius fracture with repair reconstruction as necessary, possible ORIF left ulna fracture;  Surgeon: Dominica Severin, MD;  Location: [facility] OR;  Service: Orthopedics;  Laterality: Left;  90 mins  . VENTRAL HERNIA REPAIR N/A 05/16/2017   Procedure: OPEN REPAIR INCARCERATED VENTRAL HERNIA WITH MESH PATCH;  Surgeon: Darnell Level, MD;  Location: [facility];  Service: General;  Laterality: N/A;     OB History   No obstetric history on file.     Family History  Problem Relation Age of Onset  . Hypertension Other   . Stroke Other   . Diabetes Other   . Cancer Other   . Thyroid disease Other      Social History   Tobacco Use  . Smoking status: Current Every Day Smoker    Packs/day: 0.25    Years: 15.00    Pack years: 3.75    Types: Cigarettes  . Smokeless tobacco: Never Used  Vaping Use  . Vaping Use: Some days  . Substances: THC, CBD  Substance Use Topics  . Alcohol use: Yes    Comment: ocassionally  . Drug use: Yes    Types: Marijuana    Home Medications Prior to Admission medications   Medication Sig Start Date End Date Taking? Authorizing Provider  doxycycline (VIBRAMYCIN) 100 MG capsule Take 1 capsule (100 mg total) by mouth 2 (two) times daily. 02/02/20   Pricilla Loveless, MD  naproxen (NAPROSYN) 375 MG tablet Take 1 tablet (375 mg total) by mouth 2 (two) times daily. 02/02/20   Pricilla Loveless, MD    Allergies    Fentanyl  Review of Systems   Review of Systems  Respiratory: Negative for shortness of breath.   Cardiovascular: Negative for chest pain.  Gastrointestinal: Negative for abdominal pain.  Neurological: Negative for headaches.    Physical Exam Updated Vital Signs BP 117/78 (BP Location: Right Arm)   Pulse 70   Temp 98.5 F (36.9 C) (Oral)   Resp 19   LMP 09/02/2020   SpO2 100%   Physical Exam Exam conducted with  a chaperone present.  Constitutional:      Appearance: She is well-developed.  HENT:     Head: Normocephalic and atraumatic.  Eyes:     Conjunctiva/sclera: Conjunctivae normal.  Genitourinary:    Comments: She has a small dressing over the top of her gluteal cleft.  When I remove this there was no packing in place.  She had a small incision approximately half a centimeter with clean edges.  Mild induration.  Chaperoned by nurse Felizardo Hoffmann Musculoskeletal:     Cervical back: Neck supple.  Skin:    General: Skin is warm and dry.  Neurological:     Mental Status: She is alert.     GCS: GCS eye subscore is 4. GCS verbal subscore is 5. GCS motor subscore is 6.     ED Results / Procedures / Treatments   Labs (all labs ordered  are listed, but only abnormal results are displayed) Labs Reviewed - No data to display  EKG None  Radiology No results found.  Procedures Procedures   Medications Ordered in ED Medications - No data to display  ED Course  I have reviewed the triage vital signs and the nursing notes.  Pertinent labs & imaging results that were available during my care of the patient were reviewed by me and considered in my medical decision making (see chart for details).    MDM Rules/Calculators/A&P                           Final Clinical Impression(s) / ED Diagnoses Final diagnoses:  Wound check, abscess    Rx / DC Orders ED Discharge Orders    None       Terrilee Files, MD 10/03/20 [phone]

## 2020-10-03 NOTE — Discharge Instructions (Addendum)
You are seen in the emergency department for packing removal and wound check.  When the dressing was removed there was no evidence of any packing material.  Clean dressing was placed.  This should heal up on its own.  Return to the emergency department for any worsening or concerning symptoms

## 2021-03-25 ENCOUNTER — Other Ambulatory Visit: Payer: Self-pay

## 2021-03-25 ENCOUNTER — Emergency Department (HOSPITAL_BASED_OUTPATIENT_CLINIC_OR_DEPARTMENT_OTHER)
Admission: EM | Admit: 2021-03-25 | Discharge: 2021-03-25 | Disposition: A | Discharge status: Home / Self Care | Payer: BC Managed Care – PPO | Attending: Emergency Medicine | Admitting: Emergency Medicine

## 2021-03-25 ENCOUNTER — Encounter (HOSPITAL_BASED_OUTPATIENT_CLINIC_OR_DEPARTMENT_OTHER): Payer: Self-pay

## 2021-03-25 ENCOUNTER — Emergency Department (HOSPITAL_BASED_OUTPATIENT_CLINIC_OR_DEPARTMENT_OTHER): Payer: BC Managed Care – PPO

## 2021-03-25 DIAGNOSIS — Z20822 Contact with and (suspected) exposure to covid-19: Secondary | ICD-10-CM | POA: Insufficient documentation

## 2021-03-25 DIAGNOSIS — F17210 Nicotine dependence, cigarettes, uncomplicated: Secondary | ICD-10-CM | POA: Diagnosis not present

## 2021-03-25 DIAGNOSIS — J40 Bronchitis, not specified as acute or chronic: Secondary | ICD-10-CM | POA: Insufficient documentation

## 2021-03-25 DIAGNOSIS — R059 Cough, unspecified: Secondary | ICD-10-CM | POA: Diagnosis present

## 2021-03-25 LAB — RESP PANEL BY RT-PCR (FLU A&B, COVID) ARPGX2
Influenza A by PCR: NEGATIVE
Influenza B by PCR: NEGATIVE
SARS Coronavirus 2 by RT PCR: NEGATIVE

## 2021-03-25 MED ORDER — DOXYCYCLINE HYCLATE 100 MG PO CAPS: 100.0000 mg | ORAL_CAPSULE | Freq: Two times a day (BID) | ORAL | 0 refills | Status: DC

## 2021-03-25 MED ORDER — PREDNISONE 10 MG PO TABS: ORAL_TABLET | ORAL | 0 refills | Status: DC

## 2021-03-25 NOTE — Discharge Instructions (Addendum)
Return if any problems.

## 2021-03-25 NOTE — ED Provider Notes (Signed)
[facility] EMERGENCY DEPARTMENT Provider Note   CSN: [id number] Arrival date & time: 03/25/21  1227     History Chief Complaint  Patient presents with   Cough    Tina Bauer is a 40 y.o. female.  The history is provided by the patient. No language interpreter was used.  Cough Cough characteristics:  Productive Sputum characteristics:  Nondescript Severity:  Moderate Onset quality:  Gradual Duration:  2 weeks Timing:  Constant Progression:  Worsening Chronicity:  New Smoker: no   Relieved by:  Nothing Worsened by:  Nothing Ineffective treatments:  None tried Associated symptoms: no chills       Past Medical History:  Diagnosis Date   Dyspnea    occasional due to contusion of abdoman   Headache    Hernia, umbilical     Patient Active Problem List   Diagnosis Date Noted   Incarcerated ventral hernia 05/13/2017    Past Surgical History:  Procedure Laterality Date   HERNIA REPAIR     INSERTION OF MESH N/A 05/16/2017   Procedure: INSERTION OF MESH;  Surgeon: Darnell Level, MD;  Location: [facility];  Service: General;  Laterality: N/A;   OPEN REDUCTION INTERNAL FIXATION (ORIF) DISTAL RADIAL FRACTURE Left 07/12/2016   Procedure: ORIF left distal radius fracture with repair reconstruction as necessary, possible ORIF left ulna fracture;  Surgeon: Dominica Severin, MD;  Location: [facility] OR;  Service: Orthopedics;  Laterality: Left;  90 mins   VENTRAL HERNIA REPAIR N/A 05/16/2017   Procedure: OPEN REPAIR INCARCERATED VENTRAL HERNIA WITH MESH PATCH;  Surgeon: Darnell Level, MD;  Location: [facility];  Service: General;  Laterality: N/A;     OB History   No obstetric history on file.     Family History  Problem Relation Age of Onset   Hypertension Other    Stroke Other    Diabetes Other    Cancer Other    Thyroid disease Other     Social History   Tobacco Use   Smoking status: Every Day    Packs/day: 0.25    Years: 15.00    Pack years: 3.75    Types:  Cigarettes   Smokeless tobacco: Never  Vaping Use   Vaping Use: Some days   Substances: THC, CBD  Substance Use Topics   Alcohol use: Yes    Comment: ocassionally   Drug use: Yes    Types: Marijuana    Home Medications Prior to Admission medications   Medication Sig Start Date End Date Taking? Authorizing Provider  doxycycline (VIBRAMYCIN) 100 MG capsule Take 1 capsule (100 mg total) by mouth 2 (two) times daily. 03/25/21   Elson Areas, PA-C  naproxen (NAPROSYN) 375 MG tablet Take 1 tablet (375 mg total) by mouth 2 (two) times daily. 02/02/20   Pricilla Loveless, MD    Allergies    Fentanyl  Review of Systems   Review of Systems  Constitutional:  Negative for chills.  Respiratory:  Positive for cough.   All other systems reviewed and are negative.  Physical Exam Updated Vital Signs BP 114/83   Pulse 88   Temp 98.3 F (36.8 C) (Oral)   Resp 20   Ht 5\' 8"  (1.727 m)   Wt 93 kg   LMP 03/08/2021 (Exact Date)   SpO2 98%   BMI 31.17 kg/m   Physical Exam Vitals and nursing note reviewed.  Constitutional:      Appearance: She is well-developed.  HENT:     Head: Normocephalic.  Cardiovascular:     Rate and Rhythm: Normal rate.  Pulmonary:     Effort: Pulmonary effort is normal.     Breath sounds: Rhonchi present.  Abdominal:     General: There is no distension.  Musculoskeletal:        General: Normal range of motion.     Cervical back: Normal range of motion.  Neurological:     Mental Status: She is alert and oriented to person, place, and time.  Psychiatric:        Mood and Affect: Mood normal.    ED Results / Procedures / Treatments   Labs (all labs ordered are listed, but only abnormal results are displayed) Labs Reviewed  RESP PANEL BY RT-PCR (FLU A&B, COVID) [id number]    EKG None  Radiology DG Chest Port 1 View  Result Date: 03/25/2021 CLINICAL DATA:  Cough EXAM: PORTABLE CHEST 1 VIEW COMPARISON:  11/16/2019 FINDINGS: The heart size and  mediastinal contours are within normal limits. Both lungs are clear. No pleural effusion. The visualized skeletal structures are unremarkable. IMPRESSION: No acute process the chest. Electronically Signed   By: Guadlupe Spanish M.D.   On: 03/25/2021 13:47    Procedures Procedures   Medications Ordered in ED Medications - No data to display  ED Course  I have reviewed the triage vital signs and the nursing notes.  Pertinent labs & imaging results that were available during my care of the patient were reviewed by me and considered in my medical decision making (see chart for details).    MDM Rules/Calculators/A&P                           MDM:  covid and influenza negative  Pt given rx for doxycycline  (prednione rx by error. Pt and pharmacy notified ) Final Clinical Impression(s) / ED Diagnoses Final diagnoses:  Bronchitis    Rx / DC Orders ED Discharge Orders          Ordered    predniSONE (DELTASONE) 10 MG tablet  Status:  Discontinued       Note to Pharmacy: Please provide 12 day dose pack or good taper instructions   03/25/21 1420    doxycycline (VIBRAMYCIN) 100 MG capsule  2 times daily        03/25/21 1447          An After Visit Summary was printed and given to the patient.    Elson Areas, PA-C 03/25/21 1456    Melene Plan, DO 03/26/21 [phone]

## 2021-03-25 NOTE — ED Triage Notes (Signed)
Pt c/o pleuritic CP, worsening at night. Pt also has productive cough x2 weeks. Denies N/V/D, fevers at home. Per pt, her family member recently diagnosed with bronchitis

## 2021-10-12 ENCOUNTER — Other Ambulatory Visit: Payer: Self-pay | Admitting: Surgery

## 2021-10-17 NOTE — Progress Notes (Signed)
Final path shows more endometrioma. ? ?Darnell Level, MD ?[facility] ?A [facility] practice ?Office: [phone] ? ?

## 2022-07-26 ENCOUNTER — Other Ambulatory Visit: Payer: Self-pay

## 2022-07-26 ENCOUNTER — Encounter (HOSPITAL_BASED_OUTPATIENT_CLINIC_OR_DEPARTMENT_OTHER): Payer: Self-pay | Admitting: Emergency Medicine

## 2022-07-26 ENCOUNTER — Emergency Department (HOSPITAL_BASED_OUTPATIENT_CLINIC_OR_DEPARTMENT_OTHER)
Admission: EM | Admit: 2022-07-26 | Discharge: 2022-07-26 | Disposition: A | Discharge status: Home / Self Care | Payer: BC Managed Care – PPO | Attending: Emergency Medicine | Admitting: Emergency Medicine

## 2022-07-26 DIAGNOSIS — N7689 Other specified inflammation of vagina and vulva: Secondary | ICD-10-CM | POA: Diagnosis present

## 2022-07-26 DIAGNOSIS — N764 Abscess of vulva: Secondary | ICD-10-CM | POA: Insufficient documentation

## 2022-07-26 LAB — URINALYSIS, ROUTINE W REFLEX MICROSCOPIC
Bilirubin Urine: NEGATIVE
Glucose, UA: NEGATIVE mg/dL
Hgb urine dipstick: NEGATIVE
Ketones, ur: NEGATIVE mg/dL
Leukocytes,Ua: NEGATIVE
Nitrite: NEGATIVE
Protein, ur: NEGATIVE mg/dL
Specific Gravity, Urine: 1.025 (ref 1.005–1.030)
pH: 5.5 (ref 5.0–8.0)

## 2022-07-26 LAB — PREGNANCY, URINE: Preg Test, Ur: NEGATIVE

## 2022-07-26 MED ORDER — SENNOSIDES-DOCUSATE SODIUM 8.6-50 MG PO TABS: 1.0000 | ORAL_TABLET | Freq: Every evening | ORAL | 0 refills | Status: AC | PRN

## 2022-07-26 MED ORDER — OXYCODONE-ACETAMINOPHEN 5-325 MG PO TABS: 1.0000 | ORAL_TABLET | Freq: Four times a day (QID) | ORAL | 0 refills | Status: AC | PRN

## 2022-07-26 MED ORDER — DOXYCYCLINE HYCLATE 100 MG PO TABS
100.0000 mg | ORAL_TABLET | Freq: Once | ORAL | Status: AC
  Administered 2022-07-26: 100 mg via ORAL
  Filled 2022-07-26: qty 1

## 2022-07-26 MED ORDER — LIDOCAINE-EPINEPHRINE (PF) 2 %-1:200000 IJ SOLN
10.0000 mL | Freq: Once | INTRAMUSCULAR | Status: AC
  Administered 2022-07-26: 10 mL
  Filled 2022-07-26: qty 20

## 2022-07-26 MED ORDER — DOXYCYCLINE HYCLATE 100 MG PO CAPS: 100.0000 mg | ORAL_CAPSULE | Freq: Two times a day (BID) | ORAL | 0 refills | Status: AC

## 2022-07-26 NOTE — ED Triage Notes (Signed)
Pt has abscess to labia on right side, noticed 2 days ago.  Has been getting larger and having issues sitting down.  No known fever.  No pregnancy

## 2022-07-26 NOTE — Discharge Instructions (Signed)
You were seen in the emergency room today with a labial abscess.  We performed an incision and drainage and I placed a packing material to keep this wound open.  This should be removed in the next 2 days.  Please contact your OB/GYN or the 1 listed for follow-up.  If they can see you in 2 days for removal that would be great.  If not, he can return to the ED for wound reevaluation.  Please complete your antibiotics.

## 2022-07-26 NOTE — ED Provider Notes (Signed)
Emergency Department Provider Note   I have reviewed the triage vital signs and the nursing notes.   HISTORY  Chief Complaint Abscess (Labia)   HPI Tina Bauer is a 42 y.o. female presents to the ED with labial pain and swelling. Has had symptoms like this before requiring I&D. No fever. No lower abdominal or back pain.    Past Medical History:  Diagnosis Date   Dyspnea    occasional due to contusion of abdoman   Headache    Hernia, umbilical     Review of Systems  Constitutional: No fever/chills Cardiovascular: Denies chest pain. Respiratory: Denies shortness of breath. Gastrointestinal: No abdominal pain.   Genitourinary: Negative for dysuria. Musculoskeletal: Negative for back pain. Skin: Positive labial pain.  Neurological: Negative for headaches, focal weakness or numbness.  ____________________________________________   PHYSICAL EXAM:  VITAL SIGNS: ED Triage Vitals  Enc Vitals Group     BP 07/26/22 1822 (!) 122/99     Pulse Rate 07/26/22 1822 (!) 101     Resp 07/26/22 1822 16     Temp 07/26/22 1822 98.2 F (36.8 C)     Temp Source 07/26/22 1822 Oral     SpO2 07/26/22 1822 100 %     Weight 07/26/22 1822 240 lb (108.9 kg)     Height 07/26/22 1822 5' 8"$  (1.727 m)   Constitutional: Alert and oriented. Well appearing and in no acute distress. Eyes: Conjunctivae are normal.  Head: Atraumatic. Nose: No congestion/rhinnorhea. Mouth/Throat: Mucous membranes are moist.   Neck: No stridor.   Cardiovascular: Normal rate, regular rhythm. Good peripheral circulation. Grossly normal heart sounds.   Respiratory: Normal respiratory effort.  No retractions. Lungs CTAB. Gastrointestinal: Soft and nontender. No distention.  Genitourinary: Exam performed with nurse chaperone. Approx 4 cm right labial abscess.  Musculoskeletal: No lower extremity tenderness nor edema. No gross deformities of extremities. Neurologic:  Normal speech and language. No gross focal  neurologic deficits are appreciated.  Skin:  Skin is warm, dry and intact. No rash noted.  ____________________________________________   LABS (all labs ordered are listed, but only abnormal results are displayed)  Labs Reviewed  URINALYSIS, ROUTINE W REFLEX MICROSCOPIC  PREGNANCY, URINE    ____________________________________________   PROCEDURES  Procedure(s) performed:   Marland KitchenMarland KitchenIncision and Drainage  Date/Time: 08/01/2022 9:11 AM  Performed by: Margette Fast, MD Authorized by: Margette Fast, MD   Consent:    Consent obtained:  Verbal   Consent given by:  Patient   Risks, benefits, and alternatives were discussed: yes     Risks discussed:  Bleeding, damage to other organs, incomplete drainage, infection and pain   Alternatives discussed:  No treatment Universal protocol:    Patient identity confirmed:  Verbally with patient Location:    Type:  Abscess   Size:  4 cm   Location:  Anogenital   Anogenital location:  Vulva Pre-procedure details:    Skin preparation:  Povidone-iodine Sedation:    Sedation type:  None Anesthesia:    Anesthesia method:  Local infiltration   Local anesthetic:  Lidocaine 2% WITH epi Procedure type:    Complexity:  Complex Procedure details:    Ultrasound guidance: no     Needle aspiration: no     Incision types:  Single straight   Incision depth:  Submucosal   Wound management:  Probed and deloculated   Drainage:  Purulent   Drainage amount:  Copious   Wound treatment:  Wound left open   Packing materials:  1/4 in iodoform gauze Post-procedure details:    Procedure completion:  Tolerated well, no immediate complications   ____________________________________________   INITIAL IMPRESSION / ASSESSMENT AND PLAN / ED COURSE  Pertinent labs & imaging results that were available during my care of the patient were reviewed by me and considered in my medical decision making (see chart for details).   This patient is Presenting for  Evaluation of labial pain, which does require a range of treatment options, and is a complaint that involves a high risk of morbidity and mortality.  The Differential Diagnoses include abscess, cellulitis, gangrene, sepsis, etc.  Critical Interventions-    Medications  lidocaine-EPINEPHrine (XYLOCAINE W/EPI) 2 %-1:200000 (PF) injection 10 mL (10 mLs Infiltration Given by Other 07/26/22 2129)  doxycycline (VIBRA-TABS) tablet 100 mg (100 mg Oral Given 07/26/22 2137)    Reassessment after intervention: Pain improved.   Medical Decision Making: Summary:  Patient presents to the ED with labial pain and swelling. I&D performed without complication. Packing placed. Patient to follow with Gyn.    Patient's presentation is most consistent with acute, uncomplicated illness.   Disposition: discharge  ____________________________________________  FINAL CLINICAL IMPRESSION(S) / ED DIAGNOSES  Final diagnoses:  Labial abscess     NEW OUTPATIENT MEDICATIONS STARTED DURING THIS VISIT:  Discharge Medication List as of 07/26/2022  9:25 PM     START taking these medications   Details  oxyCODONE-acetaminophen (PERCOCET/ROXICET) 5-325 MG tablet Take 1 tablet by mouth every 6 (six) hours as needed for severe pain., Starting Thu 07/26/2022, Normal    senna-docusate (SENOKOT-S) 8.6-50 MG tablet Take 1 tablet by mouth at bedtime as needed for mild constipation or moderate constipation., Starting Thu 07/26/2022, Normal        Note:  This document was prepared using Dragon voice recognition software and may include unintentional dictation errors.  Nanda Quinton, MD, [facility]    Maxmillian Carsey, [facility], MD 08/01/22 [phone]

## 2023-03-25 ENCOUNTER — Other Ambulatory Visit: Payer: Self-pay

## 2023-03-25 ENCOUNTER — Emergency Department (HOSPITAL_BASED_OUTPATIENT_CLINIC_OR_DEPARTMENT_OTHER)
Admission: EM | Admit: 2023-03-25 | Discharge: 2023-03-25 | Disposition: A | Discharge status: Home / Self Care | Payer: BC Managed Care – PPO | Attending: Emergency Medicine | Admitting: Emergency Medicine

## 2023-03-25 ENCOUNTER — Other Ambulatory Visit (HOSPITAL_BASED_OUTPATIENT_CLINIC_OR_DEPARTMENT_OTHER): Payer: Self-pay

## 2023-03-25 ENCOUNTER — Encounter (HOSPITAL_BASED_OUTPATIENT_CLINIC_OR_DEPARTMENT_OTHER): Payer: Self-pay | Admitting: Emergency Medicine

## 2023-03-25 DIAGNOSIS — R102 Pelvic and perineal pain: Secondary | ICD-10-CM | POA: Insufficient documentation

## 2023-03-25 DIAGNOSIS — N760 Acute vaginitis: Secondary | ICD-10-CM

## 2023-03-25 DIAGNOSIS — N898 Other specified noninflammatory disorders of vagina: Secondary | ICD-10-CM | POA: Insufficient documentation

## 2023-03-25 LAB — WET PREP, GENITAL
Clue Cells Wet Prep HPF POC: NONE SEEN
Sperm: NONE SEEN
Trich, Wet Prep: NONE SEEN
WBC, Wet Prep HPF POC: <10 (ref <10)

## 2023-03-25 LAB — PREGNANCY, URINE: Preg Test, Ur: NEGATIVE

## 2023-03-25 LAB — URINALYSIS, ROUTINE W REFLEX MICROSCOPIC
Bilirubin Urine: NEGATIVE
Glucose, UA: NEGATIVE mg/dL
Hgb urine dipstick: NEGATIVE
Ketones, ur: NEGATIVE mg/dL
Leukocytes,Ua: NEGATIVE
Nitrite: NEGATIVE
Protein, ur: NEGATIVE mg/dL
Specific Gravity, Urine: 1.015 (ref 1.005–1.030)
pH: 5.5 (ref 5.0–8.0)

## 2023-03-25 MED ORDER — FLUCONAZOLE 150 MG PO TABS
150.0000 mg | ORAL_TABLET | Freq: Once | ORAL | Status: AC
  Administered 2023-03-25: 150 mg via ORAL
  Filled 2023-03-25: qty 1

## 2023-03-25 MED ORDER — METRONIDAZOLE 500 MG PO TABS
500.0000 mg | ORAL_TABLET | Freq: Two times a day (BID) | ORAL | 0 refills | Status: AC
  Filled 2023-03-25: qty 14, 7d supply, fill #0

## 2023-03-25 MED ORDER — METRONIDAZOLE 500 MG PO TABS: 500.0000 mg | ORAL_TABLET | Freq: Two times a day (BID) | ORAL | 0 refills | Status: DC

## 2023-03-25 NOTE — Discharge Instructions (Signed)
Contact a health care provider if: You have abdominal or pelvic pain. You have a fever or chills. You have symptoms that last for more than 2-3 days. Get help right away if: You have a fever and your symptoms suddenly get worse.

## 2023-03-25 NOTE — ED Provider Notes (Signed)
[facility] Provider Note   CSN: [id number] Arrival date & time: 03/25/23  1044     History  Chief Complaint  Patient presents with   Vaginal Discharge    Tina Bauer is a 42 y.o. female who presents emergency department with vaginal discharge.  Symptoms have worsened over the past week.  She had on protected intercourse 2 weeks ago and symptoms started about 5 days later.  She states that she has had some mild lower pelvic pain.  She denies fevers or chills.  Last menstrual period was on 03/08/2023.   Vaginal Discharge      Home Medications Prior to Admission medications   Medication Sig Start Date End Date Taking? Authorizing Provider  naproxen (NAPROSYN) 375 MG tablet Take 1 tablet (375 mg total) by mouth 2 (two) times daily. 02/02/20   Pricilla Loveless, MD  oxyCODONE-acetaminophen (PERCOCET/ROXICET) 5-325 MG tablet Take 1 tablet by mouth every 6 (six) hours as needed for severe pain. 07/26/22   Long, Arlyss Repress, MD  senna-docusate (SENOKOT-S) 8.6-50 MG tablet Take 1 tablet by mouth at bedtime as needed for mild constipation or moderate constipation. 07/26/22   Long, Arlyss Repress, MD      Allergies    Fentanyl    Review of Systems   Review of Systems  Genitourinary:  Positive for vaginal discharge.    Physical Exam Updated Vital Signs BP (!) 132/95   Pulse 85   Temp 98.4 F (36.9 C) (Oral)   Resp 18   Wt 113.4 kg   LMP 03/08/2023 (Approximate)   SpO2 99%   BMI 38.01 kg/m  Physical Exam Vitals and nursing note reviewed.  Constitutional:      General: She is not in acute distress.    Appearance: She is well-developed. She is not diaphoretic.  HENT:     Head: Normocephalic and atraumatic.     Right Ear: External ear normal.     Left Ear: External ear normal.     Nose: Nose normal.     Mouth/Throat:     Mouth: Mucous membranes are moist.  Eyes:     General: No scleral icterus.    Conjunctiva/sclera: Conjunctivae  normal.  Cardiovascular:     Rate and Rhythm: Normal rate and regular rhythm.     Heart sounds: Normal heart sounds. No murmur heard.    No friction rub. No gallop.  Pulmonary:     Effort: Pulmonary effort is normal. No respiratory distress.     Breath sounds: Normal breath sounds.  Abdominal:     General: Bowel sounds are normal. There is no distension.     Palpations: Abdomen is soft. There is no mass.     Tenderness: There is no abdominal tenderness. There is no guarding.  Musculoskeletal:     Cervical back: Normal range of motion.  Skin:    General: Skin is warm and dry.  Neurological:     Mental Status: She is alert and oriented to person, place, and time.  Psychiatric:        Behavior: Behavior normal.     ED Results / Procedures / Treatments   Labs (all labs ordered are listed, but only abnormal results are displayed) Labs Reviewed  WET PREP, GENITAL - Abnormal; Notable for the following components:      Result Value   Yeast Wet Prep HPF POC PRESENT (*)    All other components within normal limits  URINALYSIS, ROUTINE W REFLEX MICROSCOPIC  PREGNANCY, URINE  GC/CHLAMYDIA PROBE AMP ([place]) NOT AT [facility]    EKG None  Radiology No results found.  Procedures Procedures    Medications Ordered in ED Medications  fluconazole (DIFLUCAN) tablet 150 mg (has no administration in time range)    ED Course/ Medical Decision Making/ A&P                                 Medical Decision Making 42 year old female who presents emergency department chief complaint of vaginal discharge.  Patient opted for self swabs.  No significant tenderness on physical examination.  Low suspicion for PID.  I reviewed patient's labs which shows no UTI, negative pregnancy test.  Wet prep is positive for yeast.  Patient also thinks that she has BV given the malodorous discharge.  Will treat with Flagyl.  Given single dose of Diflucan here in the emergency department appropriate for  discharge at this time with strict return precautions.  Amount and/or Complexity of Data Reviewed Labs: ordered.  Risk Prescription drug management.          Final Clinical Impression(s) / ED Diagnoses Final diagnoses:  None    Rx / DC Orders ED Discharge Orders     None         Arthor Captain, PA-C 03/25/23 1758    Virgina Norfolk, DO 03/26/23 0800

## 2023-03-25 NOTE — ED Triage Notes (Signed)
Vaginal discharge x 10 days , foul odor . No dysuria . Requesting STD testing . Unprotected sexual intercourse 1 week ago

## 2023-03-26 LAB — GC/CHLAMYDIA PROBE AMP (~~LOC~~) NOT AT ARMC
Chlamydia: NEGATIVE
Comment: NEGATIVE
Comment: NORMAL
Neisseria Gonorrhea: NEGATIVE
